# Patient Record
Sex: Male | Born: 1990 | Race: Black or African American | Hispanic: No | Marital: Single | Smoking: Current every day smoker
Health system: Southern US, Community
[De-identification: ages and names within clinical notes are randomized; demographics above are authoritative.]

## PROBLEM LIST (undated history)

## (undated) DIAGNOSIS — H9192 Unspecified hearing loss, left ear: Secondary | ICD-10-CM

---
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
## 2011-07-01 ENCOUNTER — Emergency Department (HOSPITAL_COMMUNITY)
Admission: EM | Admit: 2011-07-01 | Discharge: 2011-07-01 | Disposition: A | Discharge status: Home / Self Care | Payer: Self-pay | Attending: Emergency Medicine | Admitting: Emergency Medicine

## 2011-07-01 DIAGNOSIS — IMO0002 Reserved for concepts with insufficient information to code with codable children: Secondary | ICD-10-CM | POA: Insufficient documentation

## 2011-07-01 DIAGNOSIS — J45909 Unspecified asthma, uncomplicated: Secondary | ICD-10-CM | POA: Insufficient documentation

## 2011-07-01 DIAGNOSIS — W1809XA Striking against other object with subsequent fall, initial encounter: Secondary | ICD-10-CM | POA: Insufficient documentation

## 2011-09-16 ENCOUNTER — Encounter: Payer: Self-pay | Admitting: *Deleted

## 2011-09-16 ENCOUNTER — Emergency Department (HOSPITAL_COMMUNITY)
Admission: EM | Admit: 2011-09-16 | Discharge: 2011-09-16 | Disposition: A | Discharge status: Home / Self Care | Payer: Medicaid Other | Attending: Emergency Medicine | Admitting: Emergency Medicine

## 2011-09-16 DIAGNOSIS — J45909 Unspecified asthma, uncomplicated: Secondary | ICD-10-CM | POA: Insufficient documentation

## 2011-09-16 DIAGNOSIS — R0602 Shortness of breath: Secondary | ICD-10-CM | POA: Insufficient documentation

## 2011-09-16 MED ORDER — ALBUTEROL SULFATE HFA 108 (90 BASE) MCG/ACT IN AERS
2.0000 | INHALATION_SPRAY | RESPIRATORY_TRACT | Status: DC | PRN
  Administered 2011-09-16: 2 via RESPIRATORY_TRACT
  Filled 2011-09-16: qty 6.7

## 2011-09-16 MED ORDER — IPRATROPIUM BROMIDE 0.02 % IN SOLN
0.5000 mg | Freq: Once | RESPIRATORY_TRACT | Status: AC
  Administered 2011-09-16: 0.5 mg via RESPIRATORY_TRACT
  Filled 2011-09-16: qty 2.5

## 2011-09-16 MED ORDER — ALBUTEROL SULFATE (5 MG/ML) 0.5% IN NEBU
5.0000 mg | INHALATION_SOLUTION | Freq: Once | RESPIRATORY_TRACT | Status: AC
  Administered 2011-09-16: 5 mg via RESPIRATORY_TRACT
  Filled 2011-09-16: qty 1

## 2011-09-16 NOTE — ED Notes (Signed)
Pt reports asthma flare up tonight. Pt speaking in complete sentences, no retractions.  No audible wheezing noted. Pt ambulatory.

## 2011-09-16 NOTE — ED Provider Notes (Signed)
Medical screening examination/treatment/procedure(s) were performed by non-physician practitioner and as supervising physician I was immediately available for consultation/collaboration.   Raymond Hinde W Lamira Borin, MD 09/16/11 0713 

## 2011-09-16 NOTE — ED Notes (Signed)
Pt reports he feels like he is having an asthma attack.  Pt states that now his breathing is perfect but he hasnt had a breathing treatment in several years and feels like he needs one.  Pt denies SOB, no wheezing noted-all lung fields clear.  Skin warm, dry and intact.  Neuro intact.  Pt ambulatory without difficulty.

## 2011-09-16 NOTE — ED Provider Notes (Signed)
History     CSN: [id number]  Arrival date & time 09/16/11  4098   First MD Initiated Contact with Patient 09/16/11 0345      Chief Complaint  Patient presents with  . Asthma     Patient is a 20 y.o. male presenting with asthma. The history is provided by the patient.  Asthma This is a recurrent problem. The current episode started yesterday. The problem has been unchanged. The symptoms are aggravated by nothing.  patient reports onset of shortness of breath tonight. States it felt difficult to take a deep breath. Has a history of asthma and states the symptoms are just like symptoms he experiences with his asthma. Has not had symptoms associated with his asthma for several years so had no medication available. Denies fever chest pain or other associated symptoms.  Past Medical History  Diagnosis Date  . Asthma     History reviewed. No pertinent past surgical history.  History reviewed. No pertinent family history.  History  Substance Use Topics  . Smoking status: Never Smoker   . Smokeless tobacco: Not on file  . Alcohol Use: No      Review of Systems  Constitutional: Negative.   HENT: Negative.   Eyes: Negative.   Respiratory: Negative.   Cardiovascular: Negative.   Gastrointestinal: Negative.   Genitourinary: Negative.   Musculoskeletal: Negative.   Skin: Negative.   Neurological: Negative.   Hematological: Negative.   Psychiatric/Behavioral: Negative.     Allergies  Review of patient's allergies indicates no known allergies.  Home Medications  No current outpatient prescriptions on file.  BP 121/75  Pulse 92  Temp 97.3 F (36.3 C)  Resp 18  Ht 5\' 6"  (1.676 m)  Wt 150 lb (68.04 kg)  BMI 24.21 kg/m2  SpO2 95%  Physical Exam  Constitutional: He is oriented to person, place, and time. He appears well-developed and well-nourished.  HENT:  Head: Normocephalic and atraumatic.  Eyes: Conjunctivae are normal.  Neck: Neck supple.  Cardiovascular:  Normal rate and regular rhythm.   Pulmonary/Chest: Effort normal and breath sounds normal. Not tachypneic. No respiratory distress.       Subtle expiratory wheezes bil  Musculoskeletal: Normal range of motion.  Neurological: He is alert and oriented to person, place, and time.  Skin: Skin is warm and dry.  Psychiatric: He has a normal mood and affect.    ED Course  Procedures findings and clinical impression discussed with patient. Patient reports breathing easier after neb. BBS CTA.  Labs Reviewed - No data to display No results found.   1. Asthma       MDM  History of present illness/PE and clinical findings consistent with asthma attack/mild.        Leanne Chang, NP 09/16/11 [phone]

## 2012-01-03 ENCOUNTER — Emergency Department (HOSPITAL_COMMUNITY)
Admission: EM | Admit: 2012-01-03 | Discharge: 2012-01-03 | Disposition: A | Discharge status: Home / Self Care | Payer: Medicaid Other | Attending: Emergency Medicine | Admitting: Emergency Medicine

## 2012-01-03 ENCOUNTER — Encounter (HOSPITAL_COMMUNITY): Payer: Self-pay | Admitting: Emergency Medicine

## 2012-01-03 DIAGNOSIS — H109 Unspecified conjunctivitis: Secondary | ICD-10-CM | POA: Insufficient documentation

## 2012-01-03 DIAGNOSIS — Z711 Person with feared health complaint in whom no diagnosis is made: Secondary | ICD-10-CM | POA: Insufficient documentation

## 2012-01-03 DIAGNOSIS — H5789 Other specified disorders of eye and adnexa: Secondary | ICD-10-CM | POA: Insufficient documentation

## 2012-01-03 MED ORDER — PROPARACAINE HCL 0.5 % OP SOLN
1.0000 [drp] | Freq: Once | OPHTHALMIC | Status: DC
  Filled 2012-01-03 (×2): qty 15

## 2012-01-03 MED ORDER — AZITHROMYCIN 250 MG PO TABS
1000.0000 mg | ORAL_TABLET | Freq: Once | ORAL | Status: AC
Start: 2012-01-03 — End: 2012-01-03
  Administered 2012-01-03: 1000 mg via ORAL
  Filled 2012-01-03: qty 4

## 2012-01-03 MED ORDER — TETRACAINE HCL 0.5 % OP SOLN
OPHTHALMIC | Status: AC
  Filled 2012-01-03: qty 2

## 2012-01-03 MED ORDER — FLUORESCEIN SODIUM 1 MG OP STRP
1.0000 | ORAL_STRIP | Freq: Once | OPHTHALMIC | Status: DC
  Filled 2012-01-03: qty 1

## 2012-01-03 MED ORDER — CEFTRIAXONE SODIUM 250 MG IJ SOLR
250.0000 mg | Freq: Once | INTRAMUSCULAR | Status: AC
  Administered 2012-01-03: 250 mg via INTRAMUSCULAR
  Filled 2012-01-03 (×2): qty 250

## 2012-01-03 MED ORDER — OFLOXACIN 0.3 % OP SOLN
2.0000 [drp] | Freq: Four times a day (QID) | OPHTHALMIC | Status: DC
  Administered 2012-01-03: 2 [drp] via OPHTHALMIC
  Filled 2012-01-03: qty 5

## 2012-01-03 NOTE — ED Notes (Signed)
PT. REPORTS FOREIGN BODY AT LEFT EYE ONSET 1 WEEK AGO WITH PAIN .

## 2012-01-03 NOTE — Discharge Instructions (Signed)
Use eye drop to left eye with 2 drops 3 times daily for the next 5-7 days.  Follow up with health care clinic for further evaluation and care.  Conjunctivitis Conjunctivitis is commonly called "pink eye." Conjunctivitis can be caused by bacterial or viral infection, allergies, or injuries. There is usually redness of the lining of the eye, itching, discomfort, and sometimes discharge. There may be deposits of matter along the eyelids. A viral infection usually causes a watery discharge, while a bacterial infection causes a yellowish, thick discharge. Pink eye is very contagious and spreads by direct contact. You may be given antibiotic eyedrops as part of your treatment. Before using your eye medicine, remove all drainage from the eye by washing gently with warm water and cotton balls. Continue to use the medication until you have awakened 2 mornings in a row without discharge from the eye. Do not rub your eye. This increases the irritation and helps spread infection. Use separate towels from other household members. Wash your hands with soap and water before and after touching your eyes. Use cold compresses to reduce pain and sunglasses to relieve irritation from light. Do not wear contact lenses or wear eye makeup until the infection is gone. SEEK MEDICAL CARE IF:   Your symptoms are not better after 3 days of treatment.   You have increased pain or trouble seeing.   The outer eyelids become very red or swollen.  Document Released: 10/19/2004 Document Revised: 08/31/2011 Document Reviewed: 09/11/2005 [facility] Patient Information 2012 [place], [place].   RESOURCE GUIDE  Dental Problems  Patients with Medicaid: [facility]                     [phone] [address].                                           Phone:  [phone]                                                  If unable to pay or uninsured, contact:  [facility] or [facility]. to become qualified  for the adult dental clinic.  Chronic Pain Problems Contact [facility]  [phone] Patients need to be referred by their primary care doctor.  Insufficient Money for Medicine Contact [employer]:  call "211" or [facility] [phone].  No Primary Care Doctor Call [facility]  [phone] Other agencies that provide inexpensive medical care    [facility]  [phone]    [facility]  [phone]    [facility]  [phone]    [facility]  [phone]    [facility]  717-714-6706    [facility]  [phone]  Substance Abuse Resources [facility]  8106161115 [facility] [phone] [place] [phone] Floydene Flock [phone] Residential & Outpatient Substance Abuse Program  [phone]  Psychological Services [facility]  [phone] [facility]  [phone] [facility]   [phone] (emergency services [phone])  Abuse/Neglect [facility] Child Abuse Hotline [phone] 467-9108 [facility] Child Abuse Hotline (986)365-9023 (After Hours)  Emergency Shelter [facility] 765-794-7588  Maternity Homes  Room at the [place] [facility] [place] [phone] [facility] Services [phone]  MRSA Hotline #:   5340154974    [facility] Resources  [facility]  [employer]                           [facility] Dept. [address] [place]                     [address]         [address]  Blondell Reveal Phone:  [phone]                                  Phone:  [phone]                   Phone:  [phone]  [facility] Mental Health Phone:  [phone]  [facility] Child Abuse Hotline 973 620 [phone] (After Hours)

## 2012-01-03 NOTE — ED Provider Notes (Signed)
History     CSN: [id number]  Arrival date & time 01/03/12  9528   First MD Initiated Contact with Patient 01/03/12 [phone]      Chief Complaint  Patient presents with  . Foreign Body in Eye    (Consider location/radiation/quality/duration/timing/severity/associated sxs/prior treatment) HPI  21 year old male presents to ED with chief complaint of left eye pain. Patient states for the past week he has been noticing sensation of foreign object in his left eye. Patient can be felt usually at nighttime when he move his L eye. Symptoms associated with occasional conjunctivitis. He denies of eyes matted shut. He denies a pressure behind eyes. He denies diplopia or change in vision. He denies pain with eye movement. He denies any precipitating factor. He is a contact lens wearer.  Patient also request for an STD check. States she is sexually active with at least 3 partners in the past 6 months, unprotected. He denies any prior history of STDs. He denies asking any of his partner of their history of STD.  Patient denies fever, weight changes, night sweats. He denies of penile discharge or burning urination. He denies rash.  Past Medical History  Diagnosis Date  . Asthma     History reviewed. No pertinent past surgical history.  No family history on file.  History  Substance Use Topics  . Smoking status: Never Smoker   . Smokeless tobacco: Not on file  . Alcohol Use: No      Review of Systems  All other systems reviewed and are negative.    Allergies  Review of patient's allergies indicates no known allergies.  Home Medications  No current outpatient prescriptions on file.  BP 119/70  Pulse 76  Resp 19  SpO2 99%  Physical Exam  Nursing note and vitals reviewed. Constitutional: He appears well-developed and well-nourished. No distress.  HENT:  Head: Normocephalic.  Mouth/Throat: No oropharyngeal exudate.  Eyes: EOM are normal. Pupils are equal, round, and reactive to  light. Right eye exhibits no chemosis, no discharge, no exudate and no hordeolum. No foreign body present in the right eye. Left eye exhibits discharge. Left eye exhibits no chemosis, no exudate and no hordeolum. No foreign body present in the left eye. Right conjunctiva is not injected. Right conjunctiva has no hemorrhage. Left conjunctiva is injected. Left conjunctiva has no hemorrhage.  Neck: Neck supple.  Abdominal: Hernia confirmed negative in the right inguinal area and confirmed negative in the left inguinal area.  Genitourinary: Testes normal and penis normal. Right testis shows no swelling and no tenderness. Left testis shows no swelling and no tenderness. Circumcised. No penile tenderness. No discharge found.  Musculoskeletal: Normal range of motion.  Lymphadenopathy:    He has no cervical adenopathy.       Right: No inguinal adenopathy present.       Left: No inguinal adenopathy present.  Neurological: He is alert.  Skin: Skin is warm.    ED Course  Procedures (including critical care time)  Labs Reviewed - No data to display No results found.   No diagnosis found.  Visual Acuity - Bilateral Near: 20/30 ; R Near: 20/20 ; L Near: 20/20   MDM  21 year old male presents with left eye discomfort with foreign body sensation. Examination of L eye remarkable for limbic sparing conjunctivitis.  Mild discharge noted at the lateral canthus without evidence of abscess. No foreign body seen or palpated. Mild decreased vision change noted on visual acuity, but pt denies subjective visual changes  and sts he does not have his contact on. Normal peripheral vision test. No chemosis. No afferent pupillary defect.  Contact is not in place.  Ofloxacin ophthalmic ointment given here in ED.    Pt also request for STD check.  GC/Chlamydia culture obtained.  Will treat prophylactic with rocephen/zithromax.  Will refer to opthalmology for further evaluation.  Recommend no driving until being seen by  optho.  Pt voice understanding.       Fayrene Helper, PA-C 01/03/12 [phone]

## 2012-01-04 NOTE — ED Provider Notes (Signed)
Medical screening examination/treatment/procedure(s) were performed by non-physician practitioner and as supervising physician I was immediately available for consultation/collaboration.  Cyndra Numbers, MD 01/04/12 1309

## 2012-05-19 ENCOUNTER — Encounter (HOSPITAL_COMMUNITY): Payer: Self-pay | Admitting: Emergency Medicine

## 2012-05-19 ENCOUNTER — Emergency Department (HOSPITAL_COMMUNITY)
Admission: EM | Admit: 2012-05-19 | Discharge: 2012-05-19 | Disposition: A | Discharge status: Home / Self Care | Payer: Self-pay | Attending: Emergency Medicine | Admitting: Emergency Medicine

## 2012-05-19 DIAGNOSIS — B356 Tinea cruris: Secondary | ICD-10-CM | POA: Insufficient documentation

## 2012-05-19 DIAGNOSIS — J45909 Unspecified asthma, uncomplicated: Secondary | ICD-10-CM | POA: Insufficient documentation

## 2012-05-19 MED ORDER — CLOTRIMAZOLE 1 % EX CREA: TOPICAL_CREAM | CUTANEOUS | Status: DC

## 2012-05-19 NOTE — ED Notes (Signed)
Pt c/o rash with itching in groin area x 1 week; pt denies discharge or burning with urination

## 2012-05-19 NOTE — ED Provider Notes (Signed)
History  Scribed for Gwyneth Sprout, MD, the patient was seen in room TR07C/TR07C. This chart was scribed by Candelaria Stagers. The patient's care started at 11:29 AM    CSN: [id number]  Arrival date & time 05/19/12  4098   First MD Initiated Contact with Patient 05/19/12 1124      Chief Complaint  Patient presents with  . Rash     The history is provided by the patient. No language interpreter was used.   Raymond Orozco is a 21 y.o. male who presents to the Emergency Department complaining of a rash and itching to his groin area that started a little more than a week ago, but has gotten worse over the last week.  He denies penile discharge, dysuria, or abdominal pain.  He has applied alcohol and benadryl with no relief.  Pt reports that he has been working outside more than normal.   Past Medical History  Diagnosis Date  . Asthma     History reviewed. No pertinent past surgical history.  History reviewed. No pertinent family history.  History  Substance Use Topics  . Smoking status: Never Smoker   . Smokeless tobacco: Not on file  . Alcohol Use: No      Review of Systems  Constitutional: Negative for fever.       10 Systems reviewed and are negative for acute change except as noted in the HPI.  HENT: Negative for congestion.   Eyes: Negative for discharge and redness.  Respiratory: Negative for cough and shortness of breath.   Cardiovascular: Negative for chest pain.  Gastrointestinal: Negative for vomiting and abdominal pain.  Genitourinary: Negative for dysuria and discharge.  Musculoskeletal: Negative for back pain.  Skin: Positive for rash (groin area).  Neurological: Negative for syncope, numbness and headaches.  Psychiatric/Behavioral:       No behavior change.    Allergies  Review of patient's allergies indicates no known allergies.  Home Medications   Current Outpatient Rx  Name Route Sig Dispense Refill  . ALBUTEROL SULFATE HFA 108 (90 BASE)  MCG/ACT IN AERS Inhalation Inhale 2 puffs into the lungs every 6 (six) hours as needed. wheezing      BP 117/71  Pulse 73  Temp 98.2 F (36.8 C) (Oral)  Resp 18  SpO2 99%  Physical Exam  Nursing note and vitals reviewed. Constitutional:       Awake, alert, nontoxic appearance.  HENT:  Head: Atraumatic.  Eyes: Right eye exhibits no discharge. Left eye exhibits no discharge.  Neck: Neck supple.  Pulmonary/Chest: Effort normal. He exhibits no tenderness.  Abdominal: Soft. There is no tenderness. There is no rebound.  Genitourinary:       Groin area has small area of excoriated erythemas rash.  No skin breakdown, no fluctuants, no induration, no lesions, no testicular swelling.   Musculoskeletal: He exhibits no tenderness.       Baseline ROM, no obvious new focal weakness.  Neurological:       Mental status and motor strength appears baseline for patient and situation.  Skin: No rash noted.  Psychiatric: He has a normal mood and affect.    ED Course  Procedures   DIAGNOSTIC STUDIES: Oxygen Saturation is 99% on room air, normal by my interpretation.    COORDINATION OF CARE:     Labs Reviewed - No data to display No results found.   1. Tinea cruris       MDM   Pt with sx most consistent with  tinea cruris.  No STD sx or signs.  No lesions or other abnormalities on exam.  Clotrimazole and return for worsening sx. I personally performed the services described in this documentation, which was scribed in my presence.  The recorded information has been reviewed and considered.         Gwyneth Sprout, MD 05/19/12 [phone]

## 2012-08-25 ENCOUNTER — Emergency Department (HOSPITAL_COMMUNITY)
Admission: EM | Admit: 2012-08-25 | Discharge: 2012-08-25 | Disposition: A | Discharge status: Home / Self Care | Payer: Self-pay | Attending: Emergency Medicine | Admitting: Emergency Medicine

## 2012-08-25 ENCOUNTER — Encounter (HOSPITAL_COMMUNITY): Payer: Self-pay | Admitting: Nurse Practitioner

## 2012-08-25 DIAGNOSIS — K0889 Other specified disorders of teeth and supporting structures: Secondary | ICD-10-CM

## 2012-08-25 DIAGNOSIS — H5710 Ocular pain, unspecified eye: Secondary | ICD-10-CM | POA: Insufficient documentation

## 2012-08-25 DIAGNOSIS — H9209 Otalgia, unspecified ear: Secondary | ICD-10-CM | POA: Insufficient documentation

## 2012-08-25 DIAGNOSIS — J45909 Unspecified asthma, uncomplicated: Secondary | ICD-10-CM | POA: Insufficient documentation

## 2012-08-25 DIAGNOSIS — K089 Disorder of teeth and supporting structures, unspecified: Secondary | ICD-10-CM | POA: Insufficient documentation

## 2012-08-25 MED ORDER — PENICILLIN V POTASSIUM 500 MG PO TABS: 500.0000 mg | ORAL_TABLET | Freq: Three times a day (TID) | ORAL | Status: DC

## 2012-08-25 MED ORDER — OXYCODONE-ACETAMINOPHEN 5-325 MG PO TABS: 1.0000 | ORAL_TABLET | Freq: Four times a day (QID) | ORAL | Status: DC | PRN

## 2012-08-25 MED ORDER — IBUPROFEN 600 MG PO TABS: 600.0000 mg | ORAL_TABLET | Freq: Four times a day (QID) | ORAL | Status: DC | PRN

## 2012-08-25 NOTE — ED Provider Notes (Signed)
History   This chart was scribed for Ethelda Chick, MD by Melba Coon, ED Scribe. The patient was seen in room TR09C/TR09C and the patient's care was started at 3:38PM.    CSN: [id number]  Arrival date & time 08/25/12  1352   None     Chief Complaint  Patient presents with  . Dental Pain    (Consider location/radiation/quality/duration/timing/severity/associated sxs/prior treatment) Patient is a 21 y.o. male presenting with tooth pain. The history is provided by the patient. No language interpreter was used.  Dental PainThe primary symptoms include mouth pain (left upper) and dental injury (tooth broke about 6 months ago). Episode onset: a week ago. The symptoms are worsening. The symptoms are new.  He also reports left otalgia and left orbital pain. Tylenol and Orojel did not alleviate the pain. Denies HA, fever, neck pain, sore throat, rash, back pain, CP, SOB, abdominal pain, nausea, emesis, diarrhea, dysuria, or extremity pain, edema, weakness, numbness, or tingling. No known allergies. No other pertinent medical symptoms.  Past Medical History  Diagnosis Date  . Asthma     History reviewed. No pertinent past surgical history.  History reviewed. No pertinent family history.  History  Substance Use Topics  . Smoking status: Never Smoker   . Smokeless tobacco: Not on file  . Alcohol Use: No      Review of Systems 10 Systems reviewed and all are negative for acute change except as noted in the HPI.   Allergies  Review of patient's allergies indicates no known allergies.  Home Medications   Current Outpatient Rx  Name  Route  Sig  Dispense  Refill  . ACETAMINOPHEN 325 MG PO TABS   Oral   Take 650 mg by mouth every 6 (six) hours as needed. For pain         . BC FAST PAIN RELIEF PO   Oral   Take 1 packet by mouth 2 (two) times daily as needed. For pain         . BENZOCAINE 10 % MT GEL   Mouth/Throat   Use as directed 1 application in the mouth or  throat 3 (three) times daily as needed. For mouth pain         . IBUPROFEN 600 MG PO TABS   Oral   Take 1 tablet (600 mg total) by mouth every 6 (six) hours as needed for pain.   30 tablet   0   . OXYCODONE-ACETAMINOPHEN 5-325 MG PO TABS   Oral   Take 1-2 tablets by mouth every 6 (six) hours as needed for pain.   15 tablet   0   . PENICILLIN V POTASSIUM 500 MG PO TABS   Oral   Take 1 tablet (500 mg total) by mouth 3 (three) times daily.   30 tablet   0     BP 122/81  Pulse 55  Temp 98.4 F (36.9 C) (Oral)  Resp 16  SpO2 100%  Physical Exam  Nursing note and vitals reviewed. Constitutional:       Awake, alert, nontoxic appearance.  HENT:  Head: Atraumatic.       Dental Exam: multiple teeth with decay. Left upper canine tooth is broken at the gumline with no surrounding erythema or abscess. No swelling under the tongue.  Eyes: Right eye exhibits no discharge. Left eye exhibits no discharge.  Neck: Neck supple.  Pulmonary/Chest: Effort normal. He exhibits no tenderness.  Abdominal: Soft. There is no tenderness. There is no  rebound.  Musculoskeletal: He exhibits no tenderness.       Baseline ROM, no obvious new focal weakness.  Lymphadenopathy:    He has no cervical adenopathy.  Neurological:       Mental status and motor strength appears baseline for patient and situation.  Skin: No rash noted.  Psychiatric: He has a normal mood and affect.    ED Course  Procedures (including critical care time)  COORDINATION OF CARE:  3:44PM - abx and pain meds will be ordered for [employer]. He is advised to f/u with a dentist tomorrow. He is ready for d/c.   Labs Reviewed - No data to display No results found.   1. Pain, dental       MDM  Pt presenting with c/o dental pain.  Pt started on antibiotics, pain medication.  Given information for dental f/u.  Discharged with strict return precautions.  Pt agreeable with plan.  I personally performed the services  described in this documentation, which was scribed in my presence. The recorded information has been reviewed and is accurate.         Ethelda Chick, MD 08/27/12 [phone]

## 2012-08-25 NOTE — ED Notes (Signed)
Pt c/o L upper toothache x 1 week

## 2014-05-09 ENCOUNTER — Emergency Department (HOSPITAL_COMMUNITY)
Admission: EM | Admit: 2014-05-09 | Discharge: 2014-05-09 | Discharge status: Absent without leave | Payer: Medicaid Other | Source: Home / Self Care

## 2015-07-17 ENCOUNTER — Emergency Department (HOSPITAL_COMMUNITY)
Admission: EM | Admit: 2015-07-17 | Discharge: 2015-07-17 | Disposition: A | Discharge status: Home / Self Care | Payer: Self-pay | Attending: Emergency Medicine | Admitting: Emergency Medicine

## 2015-07-17 ENCOUNTER — Encounter (HOSPITAL_COMMUNITY): Payer: Self-pay | Admitting: *Deleted

## 2015-07-17 DIAGNOSIS — J45909 Unspecified asthma, uncomplicated: Secondary | ICD-10-CM | POA: Insufficient documentation

## 2015-07-17 DIAGNOSIS — K029 Dental caries, unspecified: Secondary | ICD-10-CM | POA: Insufficient documentation

## 2015-07-17 DIAGNOSIS — Z720 Tobacco use: Secondary | ICD-10-CM | POA: Insufficient documentation

## 2015-07-17 DIAGNOSIS — K0889 Other specified disorders of teeth and supporting structures: Secondary | ICD-10-CM | POA: Insufficient documentation

## 2015-07-17 DIAGNOSIS — Z792 Long term (current) use of antibiotics: Secondary | ICD-10-CM | POA: Insufficient documentation

## 2015-07-17 MED ORDER — PENICILLIN V POTASSIUM 500 MG PO TABS: 500.0000 mg | ORAL_TABLET | Freq: Three times a day (TID) | ORAL | Status: DC

## 2015-07-17 NOTE — ED Notes (Signed)
Declined W/C at D/C and was escorted to lobby by RN. 

## 2015-07-17 NOTE — ED Provider Notes (Signed)
CSN: [id number]     Arrival date & time 07/17/15  1653 History  By signing my name below, I, Elon Spanner, attest that this documentation has been prepared under the direction and in the presence of Camprubi-Soms, [employer], PA-C . Electronically Signed: Elon Spanner ED Scribe. 07/17/2015. 5:12 PM.    Chief Complaint  Patient presents with  . Dental Pain   Patient is a 24 y.o. male presenting with tooth pain. The history is provided by the patient and medical records. No language interpreter was used.  Dental Pain Location:  Lower (right) Lower teeth location:  32/RL 3rd molar Quality:  Sharp Severity:  Moderate Onset quality:  Gradual Duration:  1 week Timing:  Constant Progression:  Unchanged Chronicity:  Recurrent Previous work-up:  Root canal Relieved by:  Acetaminophen Worsened by:  Cold food/drink (cold air movement) Ineffective treatments:  None tried Associated symptoms: no difficulty swallowing, no drooling, no facial pain, no facial swelling, no fever, no gum swelling, no neck pain, no neck swelling, no oral lesions and no trismus   Risk factors: lack of dental care and smoking    HPI Comments: JOHNATHYN VISCOMI is a 24 y.o. male who presents to the Emergency Department complaining of constant 8/10, sharp nonradiating right lower dental pain onset 1wk ago, although has occurred intermittently over the last 56yrs; worse with cold air movement; somewhat relieved by ES Tylenol.  The patient reports a prior hx of root canal of the painful tooth 2 years ago but has not been seen by a dentist for this complaint since.  He reports this pain typically seems to surface in October or November every year.  Patient is a current smoker.  He denies gum swelling, facial swelling, trismus, fever, chills, drooling, trouble swallowing, neck swelling/pain, CP, SOB, abdominal pain, n/v/d/c, ear pain, ear drainage, hematuria, dysuria, numbness, tingling, or weakness.    Past Medical History   Diagnosis Date  . Asthma    History reviewed. No pertinent past surgical history. History reviewed. No pertinent family history. Social History  Substance Use Topics  . Smoking status: Never Smoker   . Smokeless tobacco: None  . Alcohol Use: No    Review of Systems  Constitutional: Negative for fever and chills.  HENT: Positive for dental problem. Negative for drooling, ear discharge, ear pain, facial swelling, mouth sores, sore throat and trouble swallowing.   Respiratory: Negative for shortness of breath.   Cardiovascular: Negative for chest pain.  Gastrointestinal: Negative for nausea, vomiting, abdominal pain, diarrhea and constipation.  Genitourinary: Negative for dysuria and hematuria.  Musculoskeletal: Negative for neck pain.  Skin: Negative for color change.  Allergic/Immunologic: Negative for immunocompromised state.  Neurological: Negative for weakness and numbness.  10 Systems reviewed and all are negative for acute change except as noted in the HPI.  Allergies  Review of patient's allergies indicates no known allergies.  Home Medications   Prior to Admission medications   Medication Sig Start Date End Date Taking? Authorizing Provider  acetaminophen (TYLENOL) 325 MG tablet Take 650 mg by mouth every 6 (six) hours as needed. For pain    Historical Provider, MD  Aspirin-Caffeine (BC FAST PAIN RELIEF PO) Take 1 packet by mouth 2 (two) times daily as needed. For pain    Historical Provider, MD  benzocaine (ORAJEL) 10 % mucosal gel Use as directed 1 application in the mouth or throat 3 (three) times daily as needed. For mouth pain    Historical Provider, MD  ibuprofen (ADVIL,MOTRIN)  600 MG tablet Take 1 tablet (600 mg total) by mouth every 6 (six) hours as needed for pain. 08/25/12   Jerelyn Scott, MD  oxyCODONE-acetaminophen (PERCOCET/ROXICET) 5-325 MG per tablet Take 1-2 tablets by mouth every 6 (six) hours as needed for pain. 08/25/12   Jerelyn Scott, MD  penicillin v  potassium (VEETID) 500 MG tablet Take 1 tablet (500 mg total) by mouth 3 (three) times daily. 08/25/12   Jerelyn Scott, MD   BP 124/69 mmHg  Pulse 55  Temp(Src) 98 F (36.7 C) (Oral)  Resp 16  Ht  (1.626 m)  Wt 156 lb (70.761 kg)  BMI 26.76 kg/m2  SpO2 97% Physical Exam  Constitutional: He is oriented to person, place, and time. Vital signs are normal. He appears well-developed and well-nourished.  Non-toxic appearance. No distress.  Afebrile, nontoxic, NAD  HENT:  Head: Normocephalic and atraumatic.  Nose: Nose normal.  Mouth/Throat: Uvula is midline, oropharynx is clear and moist and mucous membranes are normal. No trismus in the jaw. Dental caries present. No dental abscesses or uvula swelling.    Right lower molar #32 with TTP, mild surrounding gingival erythema without swelling or obvious abscess.  Diffuse dental decay.  Nose clear. Oropharynx clear and moist, without uvular swelling or deviation, no trismus or drooling, no tonsillar swelling or erythema, no exudates.    Eyes: Conjunctivae and EOM are normal. Right eye exhibits no discharge. Left eye exhibits no discharge.  Neck: Normal range of motion. Neck supple.  Cardiovascular: Normal rate.   Pulmonary/Chest: Effort normal. No respiratory distress.  Abdominal: Normal appearance. He exhibits no distension.  Musculoskeletal: Normal range of motion.  Neurological: He is alert and oriented to person, place, and time. He has normal strength. No sensory deficit.  Skin: Skin is warm, dry and intact. No rash noted.  Psychiatric: He has a normal mood and affect.  Nursing note and vitals reviewed.   ED Course  Procedures (including critical care time)  DIAGNOSTIC STUDIES: Oxygen Saturation is 97% on RAS, normal by my interpretation.    COORDINATION OF CARE:  5:12 PM Discussed plan to prescribe antibiotics.  Patient should f/u with dentist and stop smoking.    Labs Review Labs Reviewed - No data to display  Imaging  Review No results found. I have personally reviewed and evaluated these images and lab results as part of my medical decision-making.   EKG Interpretation None      MDM   Final diagnoses:  Pain due to dental caries  Dental decay  Tobacco abuse    23 y.o. male here with Dental pain associated with dental decay and possible dental infection with patient afebrile, non toxic appearing and swallowing secretions well. I gave patient referral to dentist and stressed the importance of dental follow up for ultimate management of dental pain.  I have also discussed reasons to return immediately to the ER.  Patient expresses understanding and agrees with plan.  I will also give PCN VK and discussed tylenol/motrin for pain. Discussed smoking cessation.  I personally performed the services described in this documentation, which was scribed in my presence. The recorded information has been reviewed and is accurate.  BP 124/69 mmHg  Pulse 55  Temp(Src) 98 F (36.7 C) (Oral)  Resp 16  Ht  (1.626 m)  Wt 156 lb (70.761 kg)  BMI 26.76 kg/m2  SpO2 97%  Meds ordered this encounter  Medications  . penicillin v potassium (VEETID) 500 MG tablet  Sig: Take 1 tablet (500 mg total) by mouth 3 (three) times daily. X 7 days    Dispense:  21 tablet    Refill:  0    Order Specific Question:  Supervising Provider    Answer:  Eber HongMILLER, BRIAN [[id number]]      Xavian Hardcastle Camprubi-Soms, PA-C 07/17/15 1723  Derwood KaplanAnkit Nanavati, MD 07/17/15 2023

## 2015-07-17 NOTE — ED Notes (Signed)
Pt reports dental pain for 7 days . Location RT bottom tooth.

## 2015-07-17 NOTE — Discharge Instructions (Signed)
Apply warm compresses to jaw throughout the day. Take antibiotic until finished. Take tylenol or motrin as needed for pain. Followup with a dentist is very important for ongoing evaluation and management of recurrent dental pain. Use the list below to find a dentist. STOP SMOKING! Return to emergency department for emergent changing or worsening symptoms.   Dental Caries Dental caries is tooth decay. This decay can cause a hole in teeth (cavity) that can get bigger and deeper over time. HOME CARE  Brush and floss your teeth. Do this at least two times a day.  Use a fluoride toothpaste.  Use a mouth rinse if told by your dentist or doctor.  Eat less sugary and starchy foods. Drink less sugary drinks.  Avoid snacking often on sugary and starchy foods. Avoid sipping often on sugary drinks.  Keep regular checkups and cleanings with your dentist.  Use fluoride supplements if told by your dentist or doctor.  Allow fluoride to be applied to teeth if told by your dentist or doctor.   This information is not intended to replace advice given to you by your health care provider. Make sure you discuss any questions you have with your health care provider.   Document Released: 06/20/2008 Document Revised: 10/02/2014 Document Reviewed: 09/13/2012 Elsevier Interactive Patient Education 2016 Elsevier Inc.  Dental Pain Dental pain may be caused by many things, including:  Tooth decay (cavities or caries). Cavities expose the nerve of your tooth to air and hot or cold temperatures. This can cause pain or discomfort.  Abscess or infection. A dental abscess is a collection of infected pus from a bacterial infection in the inner part of the tooth (pulp). It usually occurs at the end of the tooth's root.  Injury.  An unknown reason (idiopathic). Your pain may be mild or severe. It may only occur when:  You are chewing.  You are exposed to hot or cold temperature.  You are eating or drinking  sugary foods or beverages, such as soda or candy. Your pain may also be constant. HOME CARE INSTRUCTIONS Watch your dental pain for any changes. The following actions may help to lessen any discomfort that you are feeling:  Take medicines only as directed by your dentist.  If you were prescribed an antibiotic medicine, finish all of it even if you start to feel better.  Keep all follow-up visits as directed by your dentist. This is important.  Do not apply heat to the outside of your face.  Rinse your mouth or gargle with salt water if directed by your dentist. This helps with pain and swelling.  You can make salt water by adding  tsp of salt to 1 cup of warm water.  Apply ice to the painful area of your face:  Put ice in a plastic bag.  Place a towel between your skin and the bag.  Leave the ice on for 20 minutes, 2-3 times per day.  Avoid foods or drinks that cause you pain, such as:  Very hot or very cold foods or drinks.  Sweet or sugary foods or drinks. SEEK MEDICAL CARE IF:  Your pain is not controlled with medicines.  Your symptoms are worse.  You have new symptoms. SEEK IMMEDIATE MEDICAL CARE IF:  You are unable to open your mouth.  You are having trouble breathing or swallowing.  You have a fever.  Your face, neck, or jaw is swollen.   This information is not intended to replace advice given to you  by your health care provider. Make sure you discuss any questions you have with your health care provider.   Document Released: 09/11/2005 Document Revised: 01/26/2015 Document Reviewed: 09/07/2014 Elsevier Interactive Patient Education 2016 [employer]  Smoking Cessation, Tips for Success If you are ready to quit smoking, congratulations! You have chosen to help yourself be healthier. Cigarettes bring nicotine, tar, carbon monoxide, and other irritants into your body. Your lungs, heart, and blood vessels will be able to work better without these poisons.  There are many different ways to quit smoking. Nicotine gum, nicotine patches, a nicotine inhaler, or nicotine nasal spray can help with physical craving. Hypnosis, support groups, and medicines help break the habit of smoking. WHAT THINGS CAN I DO TO MAKE QUITTING EASIER?  Here are some tips to help you quit for good:  Pick a date when you will quit smoking completely. Tell all of your friends and family about your plan to quit on that date.  Do not try to slowly cut down on the number of cigarettes you are smoking. Pick a quit date and quit smoking completely starting on that day.  Throw away all cigarettes.   Clean and remove all ashtrays from your home, work, and car.  On a card, write down your reasons for quitting. Carry the card with you and read it when you get the urge to smoke.  Cleanse your body of nicotine. Drink enough water and fluids to keep your urine clear or pale yellow. Do this after quitting to flush the nicotine from your body.  Learn to predict your moods. Do not let a bad situation be your excuse to have a cigarette. Some situations in your life might tempt you into wanting a cigarette.  Never have "just one" cigarette. It leads to wanting another and another. Remind yourself of your decision to quit.  Change habits associated with smoking. If you smoked while driving or when feeling stressed, try other activities to replace smoking. Stand up when drinking your coffee. Brush your teeth after eating. Sit in a different chair when you read the paper. Avoid alcohol while trying to quit, and try to drink fewer caffeinated beverages. Alcohol and caffeine may urge you to smoke.  Avoid foods and drinks that can trigger a desire to smoke, such as sugary or spicy foods and alcohol.  Ask people who smoke not to smoke around you.  Have something planned to do right after eating or having a cup of coffee. For example, plan to take a walk or exercise.  Try a relaxation exercise  to calm you down and decrease your stress. Remember, you may be tense and nervous for the first 2 weeks after you quit, but this will pass.  Find new activities to keep your hands busy. Play with a pen, coin, or rubber band. Doodle or draw things on paper.  Brush your teeth right after eating. This will help cut down on the craving for the taste of tobacco after meals. You can also try mouthwash.   Use oral substitutes in place of cigarettes. Try using lemon drops, carrots, cinnamon sticks, or chewing gum. Keep them handy so they are available when you have the urge to smoke.  When you have the urge to smoke, try deep breathing.  Designate your home as a nonsmoking area.  If you are a heavy smoker, ask your health care provider about a prescription for nicotine chewing gum. It can ease your withdrawal from nicotine.  Reward yourself. Set  aside the cigarette money you save and buy yourself something nice.  Look for support from others. Join a support group or smoking cessation program. Ask someone at home or at work to help you with your plan to quit smoking.  Always ask yourself, "Do I need this cigarette or is this just a reflex?" Tell yourself, "Today, I choose not to smoke," or "I do not want to smoke." You are reminding yourself of your decision to quit.  Do not replace cigarette smoking with electronic cigarettes (commonly called e-cigarettes). The safety of e-cigarettes is unknown, and some may contain harmful chemicals.  If you relapse, do not give up! Plan ahead and think about what you will do the next time you get the urge to smoke. HOW WILL I FEEL WHEN I QUIT SMOKING? You may have symptoms of withdrawal because your body is used to nicotine (the addictive substance in cigarettes). You may crave cigarettes, be irritable, feel very hungry, cough often, get headaches, or have difficulty concentrating. The withdrawal symptoms are only temporary. They are strongest when you first quit  but will go away within 10-14 days. When withdrawal symptoms occur, stay in control. Think about your reasons for quitting. Remind yourself that these are signs that your body is healing and getting used to being without cigarettes. Remember that withdrawal symptoms are easier to treat than the major diseases that smoking can cause.  Even after the withdrawal is over, expect periodic urges to smoke. However, these cravings are generally short lived and will go away whether you smoke or not. Do not smoke! WHAT RESOURCES ARE AVAILABLE TO HELP ME QUIT SMOKING? Your health care provider can direct you to community resources or hospitals for support, which may include:  Group support.  Education.  Hypnosis.  Therapy.   This information is not intended to replace advice given to you by your health care provider. Make sure you discuss any questions you have with your health care provider.   Document Released: 06/09/2004 Document Revised: 10/02/2014 Document Reviewed: 02/27/2013 Elsevier Interactive Patient Education 2016 [employer].   Emergency Department Resource Guide 1) Find a Doctor and Pay Out of Pocket Although you won't have to find out who is covered by your insurance plan, it is a good idea to ask around and get recommendations. You will then need to call the office and see if the doctor you have chosen will accept you as a new patient and what types of options they offer for patients who are self-pay. Some doctors offer discounts or will set up payment plans for their patients who do not have insurance, but you will need to ask so you aren't surprised when you get to your appointment.  2) Contact Your Local Health Department Not all health departments have doctors that can see patients for sick visits, but many do, so it is worth a call to see if yours does. If you don't know where your local health department is, you can check in your phone book. The CDC also has a tool to help you  locate your state's health department, and many state websites also have listings of all of their local health departments.  3) Find a Walk-in Clinic If your illness is not likely to be very severe or complicated, you may want to try a walk in clinic. These are popping up all over the country in pharmacies, drugstores, and shopping centers. They're usually staffed by nurse practitioners or physician assistants that have been trained to  treat common illnesses and complaints. They're usually fairly quick and inexpensive. However, if you have serious medical issues or chronic medical problems, these are probably not your best option.  No Primary Care Doctor: - Call Health Connect at  [phone] - they can help you locate a primary care doctor that  accepts your insurance, provides certain services, etc. - Physician Referral Service- [phone]  Chronic Pain Problems: Organization         Address  Phone   Notes  [facility]  [phone] Patients need to be referred by their primary care doctor.   Medication Assistance: Organization         Address  Phone   Notes  [facility] [address] [place], [place] [address] [phone] --Must be a resident of [facility] -- Must have NO insurance coverage whatsoever (no Medicaid/ Medicare, etc.) -- The pt. MUST have a primary care doctor that directs their care regularly and follows them in the community   [facility]  [phone]   [place]  [phone]     Dental Care: Organization         Address  Phone  Notes  [facility] Department of [facility] [address] [place], [place] [phone] Accepts children up to age 78 who are enrolled in [place] or [place] Health Choice; pregnant women with a Medicaid card; and children who have applied for Medicaid or [place] Health Choice, but were declined, whose parents can pay a reduced fee at time of  service.  [facility] Department of [facility]  [address], [place] [phone] Accepts children up to age 16 who are enrolled in [place] or [place] Health Choice; pregnant women with a Medicaid card; and children who have applied for Medicaid or [place] Health Choice, but were declined, whose parents can pay a reduced fee at time of service.  [facility]  [address] [place], [place] [phone] Patients are seen by appointment only. Walk-ins are not accepted. [facility] will see patients 51 years of age and older. Monday - Tuesday (8am-5pm) Most Wednesdays (8:30-5pm) $30 per visit, cash only  [facility]  [address], [place] [phone] Patients are seen by appointment only. Walk-ins are not accepted. [facility] will see patients 89 years of age and older. One Wednesday Evening (Monthly: Volunteer Based).  $30 per visit, cash only  [employer]  [phone] for adults; Children under age 60, call Graduate Pediatric Dentistry at [phone]. Children aged 28-14, please call [phone] to request a pediatric application.  Dental services are provided in all areas of dental care including fillings, crowns and bridges, complete and partial dentures, implants, gum treatment, root canals, and extractions. Preventive care is also provided. Treatment is provided to both adults and children. Patients are selected via a lottery and there is often a waiting list.   [facility] [address] [place]  [phone] [email or web address]   [facility] [address] [place], [place] [phone], Ext. [phone] Second and Fourth Thursday of each month, opens at 6:30 AM; Clinic ends at 9 AM.  Patients are seen on a first-come first-served basis, and a limited number are seen during each clinic.   [facility]  [address] [place],  [place] [phone]  Eligibility Requirements You must have lived in [place], [place], or [place] counties for at least the last three months.   You cannot be eligible for state or federal sponsored [place], including [employer], [place], or [employer].   You generally cannot be eligible for healthcare insurance through your employer.    How to apply: Eligibility screenings are held every Tuesday and Wednesday afternoon from 1:00 pm until 4:00 pm. You do not need an appointment for the interview!  [facility] [address] [place], [place] [phone]   [facility] Health Department  [phone]   [facility] Health Department  [phone]   [facility] Health Department  [phone]

## 2015-08-03 ENCOUNTER — Emergency Department (HOSPITAL_COMMUNITY): Payer: Self-pay

## 2015-08-03 ENCOUNTER — Emergency Department (HOSPITAL_COMMUNITY)
Admission: EM | Admit: 2015-08-03 | Discharge: 2015-08-03 | Disposition: A | Discharge status: Home / Self Care | Payer: Self-pay | Attending: Emergency Medicine | Admitting: Emergency Medicine

## 2015-08-03 ENCOUNTER — Encounter (HOSPITAL_COMMUNITY): Payer: Self-pay | Admitting: Emergency Medicine

## 2015-08-03 DIAGNOSIS — R079 Chest pain, unspecified: Secondary | ICD-10-CM | POA: Insufficient documentation

## 2015-08-03 DIAGNOSIS — Z792 Long term (current) use of antibiotics: Secondary | ICD-10-CM | POA: Insufficient documentation

## 2015-08-03 DIAGNOSIS — J45901 Unspecified asthma with (acute) exacerbation: Secondary | ICD-10-CM | POA: Insufficient documentation

## 2015-08-03 LAB — CBC
HEMATOCRIT: 42.7 % (ref 39.0–52.0)
HEMOGLOBIN: 13.9 g/dL (ref 13.0–17.0)
MCH: 26.2 pg (ref 26.0–34.0)
MCHC: 32.6 g/dL (ref 30.0–36.0)
MCV: 80.6 fL (ref 78.0–100.0)
Platelets: 230 10*3/uL (ref 150–400)
RBC: 5.3 MIL/uL (ref 4.22–5.81)
RDW: 12.6 % (ref 11.5–15.5)
WBC: 5.3 10*3/uL (ref 4.0–10.5)

## 2015-08-03 LAB — BASIC METABOLIC PANEL
ANION GAP: 8 (ref 5–15)
BUN: 7 mg/dL (ref 6–20)
CO2: 27 mmol/L (ref 22–32)
Calcium: 9.6 mg/dL (ref 8.9–10.3)
Chloride: 105 mmol/L (ref 101–111)
Creatinine, Ser: 0.87 mg/dL (ref 0.61–1.24)
GFR calc Af Amer: >60 mL/min (ref >60)
GLUCOSE: 88 mg/dL (ref 65–99)
POTASSIUM: 4.1 mmol/L (ref 3.5–5.1)
Sodium: 140 mmol/L (ref 135–145)

## 2015-08-03 LAB — I-STAT TROPONIN, ED: Troponin i, poc: 0.01 ng/mL (ref 0.00–0.08)

## 2015-08-03 MED ORDER — IBUPROFEN 600 MG PO TABS: 600.0000 mg | ORAL_TABLET | Freq: Four times a day (QID) | ORAL | Status: DC | PRN

## 2015-08-03 NOTE — Discharge Instructions (Signed)

## 2015-08-03 NOTE — ED Notes (Signed)
Patient here with complaint of right rib pain with onset this morning while driving. States deep breath exacerbates pain. Endorses history of asthma. Denies other recent illness.

## 2015-08-03 NOTE — ED Provider Notes (Signed)
CSN: [id number]     Arrival date & time 08/03/15  0250 History   First MD Initiated Contact with Patient 08/03/15 0458     Chief Complaint  Patient presents with  . Chest Pain     Patient is a 24 y.o. male presenting with chest pain. The history is provided by the patient.  Chest Pain Pain location:  R chest Pain quality comment:  Cramping Pain radiates to:  Does not radiate Onset quality:  Sudden Duration:  5 hours Timing:  Constant Progression:  Resolved Chronicity:  New Relieved by:  Nothing Worsened by:  Deep breathing, movement and certain positions Associated symptoms: shortness of breath   Associated symptoms: no abdominal pain, no cough, no fever, no lower extremity edema and no syncope   Associated symptoms comment:  Denies hemoptysis  Risk factors: smoking   Risk factors: no coronary artery disease, no prior DVT/PE and no surgery   pt reports while at work he had onset of right sided CP He was not exerting himself Pain worse with movement/palpation He reports mild SOB  No h/o CAD/PE/DVT  Past Medical History  Diagnosis Date  . Asthma    History reviewed. No pertinent past surgical history. History reviewed. No pertinent family history. Social History  Substance Use Topics  . Smoking status: Never Smoker   . Smokeless tobacco: None  . Alcohol Use: No    Review of Systems  Constitutional: Negative for fever.  Respiratory: Positive for shortness of breath. Negative for cough.   Cardiovascular: Positive for chest pain. Negative for syncope.  Gastrointestinal: Negative for abdominal pain.  All other systems reviewed and are negative.     Allergies  Review of patient's allergies indicates no known allergies.  Home Medications   Prior to Admission medications   Medication Sig Start Date End Date Taking? Authorizing Provider  Aspirin-Caffeine (BC FAST PAIN RELIEF PO) Take 1 packet by mouth 2 (two) times daily as needed. For pain    Historical Provider,  MD  benzocaine (ORAJEL) 10 % mucosal gel Use as directed 1 application in the mouth or throat 3 (three) times daily as needed. For mouth pain    Historical Provider, MD  ibuprofen (ADVIL,MOTRIN) 600 MG tablet Take 1 tablet (600 mg total) by mouth every 6 (six) hours as needed. 08/03/15   Zadie Rhine, MD  penicillin v potassium (VEETID) 500 MG tablet Take 1 tablet (500 mg total) by mouth 3 (three) times daily. X 7 days 07/17/15   Mercedes Camprubi-Soms, PA-C   BP 122/70 mmHg  Pulse 50  Temp(Src) 98.1 F (36.7 C) (Oral)  Resp 25  Ht  (1.6 m)  Wt 157 lb 14.4 oz (71.623 kg)  BMI 27.98 kg/m2  SpO2 100% Physical Exam CONSTITUTIONAL: Well developed/well nourished HEAD: Normocephalic/atraumatic EYES: EOMI/PERRL ENMT: Mucous membranes moist NECK: supple no meningeal signs SPINE/BACK:entire spine nontender CV: S1/S2 noted, no murmurs/rubs/gallops noted Chest - no tenderness to palpation LUNGS: Lungs are clear to auscultation bilaterally, no apparent distress ABDOMEN: soft, nontender, no rebound or guarding, bowel sounds noted throughout abdomen GU:no cva tenderness NEURO: Pt is awake/alert/appropriate, moves all extremitiesx4.  No facial droop.   EXTREMITIES: pulses normal/equal, full ROM, no calf tenderness noted SKIN: warm, color normal PSYCH: no abnormalities of mood noted, alert and oriented to situation  ED Course  Procedures   Pt well appearing Pain resolved PERC negative, I doubt PE I have low suspicion for ACS I have low suspicion for dissection I also doubt pericarditis/myocarditis given  history Discussed return precautions  Labs Review Labs Reviewed  BASIC METABOLIC PANEL  CBC  I-STAT TROPOININ, ED    Imaging Review Dg Chest 2 View  08/03/2015  CLINICAL DATA:  Right-sided chest pain since last night. History of asthma. EXAM: CHEST  2 VIEW COMPARISON:  11/08/2005 FINDINGS: Normal heart size and mediastinal contours. No acute infiltrate or edema. No effusion  or pneumothorax. No acute osseous findings. IMPRESSION: Negative chest. Electronically Signed   By: Marnee SpringJonathon  Watts M.D.   On: 08/03/2015 03:17   I have personally reviewed and evaluated these images and lab results as part of my medical decision-making.   EKG Interpretation   Date/Time:  Tuesday August 03 2015 03:02:04 EST Ventricular Rate:  66 PR Interval:  186 QRS Duration: 84 QT Interval:  382 QTC Calculation: 400 R Axis:   88 Text Interpretation:  Normal sinus rhythm with sinus arrhythmia Minimal  voltage criteria for LVH, may be normal variant ST elevation, consider  early repolarization Borderline ECG Confirmed by Bebe ShaggyWICKLINE  MD, Dorinda HillNALD  [phone]([id number]) on 08/03/2015 3:03:32 AM      MDM   Final diagnoses:  Chest pain, unspecified chest pain type    Nursing notes including past medical history and social history reviewed and considered in documentation xrays/imaging reviewed by myself and considered during evaluation Labs/vital reviewed myself and considered during evaluation     Zadie Rhineonald Ottis Vacha, MD 08/03/15 [phone]

## 2015-09-11 ENCOUNTER — Emergency Department (HOSPITAL_COMMUNITY)
Admission: EM | Admit: 2015-09-11 | Discharge: 2015-09-11 | Disposition: A | Discharge status: Home / Self Care | Payer: Self-pay | Attending: Emergency Medicine | Admitting: Emergency Medicine

## 2015-09-11 ENCOUNTER — Encounter (HOSPITAL_COMMUNITY): Payer: Self-pay | Admitting: Emergency Medicine

## 2015-09-11 DIAGNOSIS — H6092 Unspecified otitis externa, left ear: Secondary | ICD-10-CM | POA: Insufficient documentation

## 2015-09-11 DIAGNOSIS — J45909 Unspecified asthma, uncomplicated: Secondary | ICD-10-CM | POA: Insufficient documentation

## 2015-09-11 MED ORDER — CIPROFLOXACIN-DEXAMETHASONE 0.3-0.1 % OT SUSP: 4.0000 [drp] | Freq: Two times a day (BID) | OTIC | Status: DC

## 2015-09-11 MED ORDER — IBUPROFEN 800 MG PO TABS: 800.0000 mg | ORAL_TABLET | Freq: Three times a day (TID) | ORAL | Status: DC | PRN

## 2015-09-11 NOTE — Discharge Instructions (Signed)
Return here as needed.  Follow up with a primary care doctor °

## 2015-09-11 NOTE — ED Notes (Signed)
Pt able to ambulate independently 

## 2015-09-11 NOTE — ED Provider Notes (Signed)
CSN: [id number]     Arrival date & time 09/11/15  2008 History   First MD Initiated Contact with Patient 09/11/15 2017     Chief Complaint  Patient presents with  . Otalgia     (Consider location/radiation/quality/duration/timing/severity/associated sxs/prior Treatment) HPI Patient presents to the emergency department with complaints of left ear canal pain.  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  Patient also states that the pain started 1 week ago.  He has had this bump behind his ear.  His entire life.  He is asking if we can examine it.  Patient denies fever, nausea, vomiting, headache, blurred vision, back pain, neck pain, chest pain, shortness of breath, weakness, or syncope.  The patient states that he did not take any medications prior to arrival.  He did put olive oil in his left ear canal Past Medical History  Diagnosis Date  . Asthma    History reviewed. No pertinent past surgical history. History reviewed. No pertinent family history. Social History  Substance Use Topics  . Smoking status: Never Smoker   . Smokeless tobacco: None  . Alcohol Use: Yes     Comment: socially    Review of Systems  All other systems negative except as documented in the HPI. All pertinent positives and negatives as reviewed in the HPI.  Allergies  Review of patient's allergies indicates no known allergies.  Home Medications   Prior to Admission medications   Medication Sig Start Date End Date Taking? Authorizing Provider  Aspirin-Caffeine (BC FAST PAIN RELIEF PO) Take 1 packet by mouth 2 (two) times daily as needed. For pain    Historical Provider, MD  benzocaine (ORAJEL) 10 % mucosal gel Use as directed 1 application in the mouth or throat 3 (three) times daily as needed. For mouth pain    Historical Provider, MD  ibuprofen (ADVIL,MOTRIN) 600 MG tablet Take 1 tablet (600 mg total) by mouth every 6 (six) hours as needed. 08/03/15   Zadie Rhine, MD  penicillin v potassium (VEETID) 500 MG tablet Take 1 tablet (500 mg total) by mouth 3 (three) times daily. X 7 days 07/17/15   Mercedes Camprubi-Soms, PA-C   BP 113/64 mmHg  Pulse 66  Temp(Src) 97.8 F (36.6 C) (Oral)  Resp 18  Wt 70.024 kg  SpO2 100% Physical Exam  Constitutional: He is oriented to person, place, and time. He appears well-developed and well-nourished. No distress.  HENT:  Head: Normocephalic and atraumatic.  Right Ear: Tympanic membrane and ear canal normal.  Left Ear: Tympanic membrane normal.  Ears:  Mouth/Throat: Oropharynx is clear and moist.  Eyes: Pupils are equal, round, and reactive to light.  Neck: Normal range of motion. Neck supple.  Cardiovascular: Normal rate, regular rhythm and normal heart sounds.  Exam reveals no gallop and no friction rub.   No murmur heard. Pulmonary/Chest: Effort normal and breath sounds normal. No respiratory distress. He has no wheezes.  Neurological: He is alert and oriented to person, place, and time. He exhibits normal muscle tone. Coordination normal.  Skin: Skin is warm and dry. No rash noted. No erythema.  Psychiatric: He has a normal mood and affect. His behavior is normal.  Nursing note and vitals reviewed.   ED Course  Procedures (including critical care time) Labs Review Labs Reviewed - No data to display  Imaging Review No results found. I have personally reviewed and evaluated these images and lab results as  part of my medical decision-making.   EKG Interpretation None        Patient be treated for irritation of his left auditory canal.  He does have this area behind the left ear that is a cystic like structure that he states is been there for quite a while since early childhood.  Patient is advised return here as needed and advised follow-up with ENT  Charlestine Nighthristopher Evett Kassa, PA-C 09/12/15 2328  Gerhard Munchobert Lockwood, MD 09/12/15 [phone]

## 2015-09-11 NOTE — ED Notes (Signed)
Pt presents to ED for assessment of ear pain on the left side.  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  Denies discharge.  Pt states pain started approx 1 week ago.

## 2016-03-20 ENCOUNTER — Emergency Department (HOSPITAL_COMMUNITY): Payer: Self-pay

## 2016-03-20 ENCOUNTER — Encounter (HOSPITAL_COMMUNITY): Payer: Self-pay | Admitting: Emergency Medicine

## 2016-03-20 ENCOUNTER — Emergency Department (HOSPITAL_COMMUNITY)
Admission: EM | Admit: 2016-03-20 | Discharge: 2016-03-20 | Disposition: A | Discharge status: Home / Self Care | Payer: Self-pay | Attending: Emergency Medicine | Admitting: Emergency Medicine

## 2016-03-20 DIAGNOSIS — S0011XA Contusion of right eyelid and periocular area, initial encounter: Secondary | ICD-10-CM

## 2016-03-20 DIAGNOSIS — J45909 Unspecified asthma, uncomplicated: Secondary | ICD-10-CM | POA: Insufficient documentation

## 2016-03-20 DIAGNOSIS — H578 Other specified disorders of eye and adnexa: Secondary | ICD-10-CM | POA: Insufficient documentation

## 2016-03-20 DIAGNOSIS — S025XXA Fracture of tooth (traumatic), initial encounter for closed fracture: Secondary | ICD-10-CM | POA: Insufficient documentation

## 2016-03-20 DIAGNOSIS — Y9289 Other specified places as the place of occurrence of the external cause: Secondary | ICD-10-CM | POA: Insufficient documentation

## 2016-03-20 DIAGNOSIS — Y939 Activity, unspecified: Secondary | ICD-10-CM | POA: Insufficient documentation

## 2016-03-20 DIAGNOSIS — R6884 Jaw pain: Secondary | ICD-10-CM

## 2016-03-20 DIAGNOSIS — Y999 Unspecified external cause status: Secondary | ICD-10-CM | POA: Insufficient documentation

## 2016-03-20 MED ORDER — KETOROLAC TROMETHAMINE 60 MG/2ML IM SOLN
60.0000 mg | Freq: Once | INTRAMUSCULAR | Status: AC
  Administered 2016-03-20: 60 mg via INTRAMUSCULAR
  Filled 2016-03-20: qty 2
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
## 2018-02-05 ENCOUNTER — Other Ambulatory Visit (HOSPITAL_COMMUNITY): Payer: Self-pay | Admitting: Otolaryngology

## 2018-02-05 ENCOUNTER — Other Ambulatory Visit: Payer: Self-pay | Admitting: Otolaryngology

## 2018-02-05 ENCOUNTER — Other Ambulatory Visit: Payer: Self-pay | Admitting: Physical Therapy

## 2018-02-05 DIAGNOSIS — H903 Sensorineural hearing loss, bilateral: Secondary | ICD-10-CM

## 2018-02-28 ENCOUNTER — Other Ambulatory Visit (HOSPITAL_COMMUNITY): Payer: Self-pay | Admitting: Otolaryngology

## 2018-02-28 ENCOUNTER — Ambulatory Visit (HOSPITAL_COMMUNITY)
Admission: RE | Admit: 2018-02-28 | Discharge: 2018-02-28 | Disposition: A | Discharge status: Home / Self Care | Payer: Self-pay | Source: Ambulatory Visit | Attending: Otolaryngology | Admitting: Otolaryngology

## 2018-02-28 DIAGNOSIS — H903 Sensorineural hearing loss, bilateral: Secondary | ICD-10-CM

## 2018-02-28 MED ORDER — IOHEXOL 300 MG/ML  SOLN
75.0000 mL | Freq: Once | INTRAMUSCULAR | Status: AC | PRN
  Administered 2018-02-28: 75 mL via INTRAVENOUS

## 2018-04-08 ENCOUNTER — Encounter (HOSPITAL_COMMUNITY): Payer: Self-pay | Admitting: Emergency Medicine

## 2018-04-08 ENCOUNTER — Emergency Department (HOSPITAL_COMMUNITY)
Admission: EM | Admit: 2018-04-08 | Discharge: 2018-04-08 | Disposition: A | Discharge status: Home / Self Care | Payer: Self-pay | Attending: Emergency Medicine | Admitting: Emergency Medicine

## 2018-04-08 ENCOUNTER — Other Ambulatory Visit: Payer: Self-pay

## 2018-04-08 DIAGNOSIS — J45909 Unspecified asthma, uncomplicated: Secondary | ICD-10-CM | POA: Insufficient documentation

## 2018-04-08 DIAGNOSIS — L42 Pityriasis rosea: Secondary | ICD-10-CM | POA: Insufficient documentation

## 2018-04-08 DIAGNOSIS — L299 Pruritus, unspecified: Secondary | ICD-10-CM

## 2018-04-08 DIAGNOSIS — Z7982 Long term (current) use of aspirin: Secondary | ICD-10-CM | POA: Insufficient documentation

## 2018-04-08 MED ORDER — HYDROXYZINE HCL 25 MG PO TABS: 25.0000 mg | ORAL_TABLET | Freq: Three times a day (TID) | ORAL | 0 refills | Status: AC | PRN

## 2018-04-08 MED ORDER — HYDROCORTISONE 0.5 % EX CREA: 1.0000 "application " | TOPICAL_CREAM | Freq: Two times a day (BID) | CUTANEOUS | 0 refills | Status: DC

## 2018-04-08 NOTE — ED Triage Notes (Signed)
Pt states he has bumps/rash to flanks and abdomen for 3 days.

## 2018-04-08 NOTE — Discharge Instructions (Addendum)
You have pityriasis rosea, which is a non-harmful rash caused by a common virus that most humans are exposed to. It is not contagious. The rash should go away in the next few weeks. In the meantime, you can apply hydrocortisone cream to the itchiest areas twice per day as needed. You can also take the medication hydroxyzine as needed for itching up to 3 times per day. Sunshine also helps this rash. This medication is similar to benadryl and may make you feel sleepy. If the rash does not go away, please f/u with your PCP.

## 2018-04-08 NOTE — ED Notes (Signed)
Declined W/C at D/C and was escorted to lobby by RN. 

## 2018-04-08 NOTE — ED Provider Notes (Signed)
[facility] EMERGENCY DEPARTMENT Provider Note   CSN: [id number] Arrival date & time: 04/08/18  4098   History   Chief Complaint Chief Complaint  Patient presents with  . Rash    HPI Raymond Orozco is a 27 y.o. male with a medical history of single-sided hearing and asthma who presents to the ED with an itchy rash on his arms, back, and torso that has been present for 3-4 days. He reports that the rash came on all at once and he does not think he's getting new lesions. He has tried benadryl which temporarily relieves the itching and oatmeal baths. He is concerned that he has scabies because his niece, whom he lives with, was treated for scabies 4 months ago. He denies any fevers, chills, new medications, and new topical exposures.   Past Medical History:  Diagnosis Date  . Asthma     There are no active problems to display for this patient.   No past surgical history on file.      Home Medications    Prior to Admission medications   Medication Sig Start Date End Date Taking? Authorizing Provider  Aspirin-Caffeine (BC FAST PAIN RELIEF PO) Take 1 packet by mouth 2 (two) times daily as needed. For pain    [provider]  hydrocortisone cream 0.5 % Apply 1 application topically 2 (two) times daily. 04/08/18   Raeshawn Tafolla, Cathleen Corti, MD  hydrOXYzine (ATARAX/VISTARIL) 25 MG tablet Take 1 tablet (25 mg total) by mouth 3 (three) times daily as needed for up to 7 days for itching. 04/08/18 04/15/18  Arsema Tusing, Cathleen Corti, MD    Family History No family history on file.  Social History Social History   Tobacco Use  . Smoking status: Never Smoker  Substance Use Topics  . Alcohol use: Yes    Comment: socially  . Drug use: No     Allergies   Patient has no known allergies.   Review of Systems Review of Systems  Constitutional: Negative for chills and fever.  HENT: Negative for rhinorrhea and sore throat.   Eyes: Negative for visual disturbance.    Respiratory: Negative for cough and shortness of breath.   Cardiovascular: Negative for chest pain and leg swelling.  Gastrointestinal: Negative for abdominal pain, nausea and vomiting.  Genitourinary: Negative for difficulty urinating, discharge, dysuria and genital sores.  Musculoskeletal: Negative for back pain and neck pain.  Skin: Positive for rash. Negative for wound.       Pruritus  Neurological: Negative for weakness and headaches.     Physical Exam Updated Vital Signs BP 126/82   Pulse 72   Temp 98.3 F (36.8 C) (Oral)   Resp 18   Ht 5\' 5"  (1.651 m)   Wt 75.8 kg (167 lb)   SpO2 100%   BMI 27.79 kg/m   Physical Exam  Constitutional: He is oriented to person, place, and time. He appears well-developed and well-nourished. No distress.  HENT:  Head: Normocephalic and atraumatic.  Eyes: Pupils are equal, round, and reactive to light. EOM are normal.  Neck: Normal range of motion. Neck supple.  Abdominal: Soft. He exhibits no distension. There is no tenderness.  Genitourinary: Penis normal. No penile tenderness.  Genitourinary Comments: No genital rash involvement or genital lesions.  Musculoskeletal: Normal range of motion. He exhibits no edema or deformity.  Neurological: He is alert and oriented to person, place, and time.  Skin: Skin is warm and dry. Capillary refill takes less than  2 seconds.  Pink plaques of varying sizes without scale are diffusely scattered on the trunk, back, and upper arms. Herald patch located on right upper abdomen. No palm or sole involvement. No interdigital furrows.  Psychiatric: He has a normal mood and affect. His behavior is normal.     ED Treatments / Results  Labs (all labs ordered are listed, but only abnormal results are displayed) Labs Reviewed - No data to display  EKG None  Radiology No results found.  Procedures Procedures (including critical care time)  Medications Ordered in ED Medications - No data to  display   Initial Impression / Assessment and Plan / ED Course  I have reviewed the triage vital signs and the nursing notes.  Pertinent labs & imaging results that were available during my care of the patient were reviewed by me and considered in my medical decision making (see chart for details).  Raymond Orozco is a 27 y.o. male with a medical history of single-sided hearing and asthma who presents to the ED with a scattered pruritic pink plaque eruption on his torso, back, and upper arms with a herald patch characteristic of pityriasis rosea. Upon arrival to the ED, patient was afebrile and hemodynamically stable. Based on clinical appearance of rash, pityriasis rosea is suspected. Pt safe for discharge home with topical hydrocortisone and hydroxyzine for pruritus. Pt advised to return to the ED with fevers, spreading rash, or any other new or concerning symptoms.  Final Clinical Impressions(s) / ED Diagnoses   Final diagnoses:  Pityriasis rosea  Pruritus    ED Discharge Orders        Ordered    hydrocortisone cream 0.5 %  2 times daily     04/08/18 0919    hydrOXYzine (ATARAX/VISTARIL) 25 MG tablet  3 times daily PRN     04/08/18 0919       Isis Costanza, Cathleen Cortieborah N, MD 04/08/18 1025    Melene PlanFloyd, Dan, DO 04/08/18 1137

## 2018-06-27 ENCOUNTER — Emergency Department (HOSPITAL_COMMUNITY)
Admission: EM | Admit: 2018-06-27 | Discharge: 2018-06-27 | Disposition: A | Discharge status: Home / Self Care | Payer: Self-pay | Attending: Emergency Medicine | Admitting: Emergency Medicine

## 2018-06-27 ENCOUNTER — Encounter (HOSPITAL_COMMUNITY): Payer: Self-pay

## 2018-06-27 ENCOUNTER — Other Ambulatory Visit: Payer: Self-pay

## 2018-06-27 DIAGNOSIS — Z79899 Other long term (current) drug therapy: Secondary | ICD-10-CM | POA: Insufficient documentation

## 2018-06-27 DIAGNOSIS — J45909 Unspecified asthma, uncomplicated: Secondary | ICD-10-CM | POA: Insufficient documentation

## 2018-06-27 DIAGNOSIS — K029 Dental caries, unspecified: Secondary | ICD-10-CM | POA: Insufficient documentation

## 2018-06-27 MED ORDER — NAPROXEN 500 MG PO TABS: 500.0000 mg | ORAL_TABLET | Freq: Two times a day (BID) | ORAL | 0 refills | Status: DC

## 2018-06-27 MED ORDER — TRAMADOL HCL 50 MG PO TABS: 50.0000 mg | ORAL_TABLET | Freq: Four times a day (QID) | ORAL | 0 refills | Status: DC | PRN

## 2018-06-27 MED ORDER — AMOXICILLIN 500 MG PO CAPS: 500.0000 mg | ORAL_CAPSULE | Freq: Three times a day (TID) | ORAL | 0 refills | Status: DC

## 2018-06-27 NOTE — ED Triage Notes (Signed)
Pt reports rt sided dental pain that started over the weekend that has gotten worse. Pt reports rt ear pain and facial swelling. Pt reports pain medications have not been helping anymore. Denies any oral trauma.

## 2018-06-27 NOTE — ED Triage Notes (Deleted)
Pt states he took a walk earlier today and exacerbated his asthma. Pt states hx of same, reports using inhaler twice overnight without improvement.

## 2018-06-27 NOTE — Discharge Instructions (Signed)
You have been seen by your caregiver because of dental pain.  °SEEK MEDICAL ATTENTION IF: °The exam and treatment you received today has been provided on an emergency basis only. This is not a substitute for complete medical or dental care. If your problem worsens or new symptoms (problems) appear, and you are unable to arrange prompt follow-up care with your dentist, call or return to this location. °CALL YOUR DENTIST OR RETURN IMMEDIATELY IF you develop a fever, rash, difficulty breathing or swallowing, neck or facial swelling, or other potentially serious concerns. ° ° °[employer]  °[employer]  °[facility]  °[address]  °[place],  [place] [address]  °Phone [phone]  °[facility] [employer] [facility] in [place], [place], exemplifies the Dental School?s vision to improve the health and quality of life of all [place] by creating leaders with a passion to care for the underserved and by leading the nation in community-based, service learning oral health education. °We are committed to offering comprehensive general dental services for adults, children and special needs patients in a safe, caring and professional setting. ° °Appointments: Our clinic is open Monday through Friday 8:00 a.m. until 5:00 p.m. The amount of time scheduled for an appointment depends on the patient?s specific needs. We ask that you keep your appointed time for care or provide 24-hour notice of all appointment changes. Parents or legal guardians must accompany minor children. ° °Payment for Services: Medicaid and other insurance plans are welcome. Payment for services is due when services are rendered and may be made by cash or credit card. If you have dental insurance, we will assist you with your claim submission.  ° °Emergencies: Emergency services will be provided Monday through Friday on a  walk-in basis. Please arrive early for emergency services. After hours emergency services will be provided for patients of record as required. ° °Services:  °Comprehensive General Dentistry  °Children?s Dentistry  °Oral Surgery - Extractions  °Root Canals  °Sealants and Tooth Colored Fillings  °Crowns and Bridges  °Dentures and Partial Dentures  °Implant Services  °Periodontal Services and Cleanings  °Cosmetic Tooth Whitening  °Digital Radiography  °3-D/[facility] Imaging ° ° °

## 2018-06-27 NOTE — ED Provider Notes (Signed)
[facility] EMERGENCY DEPARTMENT Provider Note   CSN: [id number] Arrival date & time: 06/27/18  1324     History   Chief Complaint Chief Complaint  Patient presents with  . Dental Pain    HPI Raymond Orozco is a 27 y.o. male with a past medical history of dental caries who presents the emergency department with chief complaint of tooth pain.  Patient states that he has had on and off pain in his upper and lower right second molars for the past week or 2.  Over the past 48 hours the pain has become constant, worsening, worse with any pressure.  He is unable to eat on that side.  He denies any shortness of breath, difficulty swallowing, trismus, change in voice or fevers   HPI  Past Medical History:  Diagnosis Date  . Asthma     There are no active problems to display for this patient.   History reviewed. No pertinent surgical history.      Home Medications    Prior to Admission medications   Medication Sig Start Date End Date Taking? Authorizing Provider  amoxicillin (AMOXIL) 500 MG capsule Take 1 capsule (500 mg total) by mouth 3 (three) times daily. 06/27/18   Arthor Captain, PA-C  hydrocortisone cream 0.5 % Apply 1 application topically 2 (two) times daily. 04/08/18   Dorrell, Cathleen Corti, MD  naproxen (NAPROSYN) 500 MG tablet Take 1 tablet (500 mg total) by mouth 2 (two) times daily. 06/27/18   Dorla Guizar, Cammy Copa, PA-C  traMADol (ULTRAM) 50 MG tablet Take 1 tablet (50 mg total) by mouth every 6 (six) hours as needed. 06/27/18   Arthor Captain, PA-C    Family History History reviewed. No pertinent family history.  Social History Social History   Tobacco Use  . Smoking status: Never Smoker  . Smokeless tobacco: Never Used  Substance Use Topics  . Alcohol use: Yes    Comment: socially  . Drug use: No     Allergies   Patient has no known allergies.   Review of Systems Review of Systems Ten systems reviewed and are negative for acute  change, except as noted in the HPI. \  Physical Exam Updated Vital Signs BP 120/88 (BP Location: Right Arm)   Pulse 65   Temp 98.2 F (36.8 C) (Oral)   Resp 16   Ht 5\' 2"  (1.575 m)   Wt 74.8 kg   SpO2 100%   BMI 30.18 kg/m   Physical Exam  Constitutional: He appears well-developed and well-nourished. No distress.  HENT:  Head: Normocephalic and atraumatic.  Mouth/Throat: Uvula is midline, oropharynx is clear and moist and mucous membranes are normal. Dental caries present. No dental abscesses.    Eyes: Conjunctivae are normal. No scleral icterus.  Neck: Normal range of motion. Neck supple.  Cardiovascular: Normal rate, regular rhythm and normal heart sounds.  Pulmonary/Chest: Effort normal and breath sounds normal. No respiratory distress.  Abdominal: Soft. There is no tenderness.  Musculoskeletal: He exhibits no edema.  Neurological: He is alert.  Skin: Skin is warm and dry. He is not diaphoretic.  Psychiatric: His behavior is normal.  Nursing note and vitals reviewed.    ED Treatments / Results  Labs (all labs ordered are listed, but only abnormal results are displayed) Labs Reviewed - No data to display  EKG None  Radiology No results found.  Procedures Procedures (including critical care time)  Medications Ordered in ED Medications - No data to display  Initial Impression / Assessment and Plan / ED Course  I have reviewed the triage vital signs and the nursing notes.  Pertinent labs & imaging results that were available during my care of the patient were reviewed by me and considered in my medical decision making (see chart for details).     Patient with dentalgia.  No abscess requiring immediate incision and drainage.  Exam not concerning for Ludwig's angina or pharyngeal abscess.  . Pt instructed to follow-up with dentist.  Discussed return precautions. Pt safe for discharge.   Final Clinical Impressions(s) / ED Diagnoses   Final diagnoses:    Pain due to dental caries    ED Discharge Orders         Ordered    amoxicillin (AMOXIL) 500 MG capsule  3 times daily     06/27/18 0810    naproxen (NAPROSYN) 500 MG tablet  2 times daily     06/27/18 0810    traMADol (ULTRAM) 50 MG tablet  Every 6 hours PRN     06/27/18 0810           Arthor Captain, PA-C 06/27/18 0852    Little, Ambrose Finland, MD 06/28/18 1530

## 2018-06-28 DIAGNOSIS — R112 Nausea with vomiting, unspecified: Secondary | ICD-10-CM | POA: Insufficient documentation

## 2018-06-28 DIAGNOSIS — K0889 Other specified disorders of teeth and supporting structures: Secondary | ICD-10-CM | POA: Insufficient documentation

## 2018-06-28 DIAGNOSIS — Z5321 Procedure and treatment not carried out due to patient leaving prior to being seen by health care provider: Secondary | ICD-10-CM | POA: Insufficient documentation

## 2018-06-28 DIAGNOSIS — R1084 Generalized abdominal pain: Secondary | ICD-10-CM | POA: Insufficient documentation

## 2018-06-29 ENCOUNTER — Other Ambulatory Visit: Payer: Self-pay

## 2018-06-29 ENCOUNTER — Emergency Department (HOSPITAL_COMMUNITY)
Admission: EM | Admit: 2018-06-29 | Discharge: 2018-06-29 | Disposition: A | Discharge status: Home / Self Care | Payer: Self-pay | Attending: Emergency Medicine | Admitting: Emergency Medicine

## 2018-06-29 ENCOUNTER — Encounter (HOSPITAL_COMMUNITY): Payer: Self-pay | Admitting: Emergency Medicine

## 2018-06-29 NOTE — ED Notes (Signed)
Pt came to nurse first stating he wanted to leave. This EMT convinced pt to stay for another an additional hour because we had rooms coming open. Pt stated he could not wait any longer. Pt's armband cut off by Conor, EMT and pt noted to leave lobby via front doors.

## 2018-06-29 NOTE — ED Triage Notes (Addendum)
Patient states he was seen in ED yesterday for dental pain.  C/o nausea, vomiting, and generalized abd pain since taking medications for dental pain.  Denies known allergies.

## 2018-06-29 NOTE — ED Notes (Signed)
Pt approached EMT in lobby and wants to leave. Pt comes to this EMT to turn in labels. Pt encouraged to stay and asked to wait to speak to a provider/RN before leaving. Pt refuses this process. Allows armband to be cut off. Pt seen walking out lobby. To parking lot.

## 2019-03-09 ENCOUNTER — Other Ambulatory Visit: Payer: Self-pay

## 2019-03-09 ENCOUNTER — Encounter (HOSPITAL_COMMUNITY): Payer: Self-pay | Admitting: Emergency Medicine

## 2019-03-09 ENCOUNTER — Emergency Department (HOSPITAL_COMMUNITY)
Admission: EM | Admit: 2019-03-09 | Discharge: 2019-03-09 | Disposition: A | Discharge status: Home / Self Care | Payer: Self-pay | Attending: Emergency Medicine | Admitting: Emergency Medicine

## 2019-03-09 DIAGNOSIS — J45909 Unspecified asthma, uncomplicated: Secondary | ICD-10-CM | POA: Insufficient documentation

## 2019-03-09 DIAGNOSIS — K529 Noninfective gastroenteritis and colitis, unspecified: Secondary | ICD-10-CM | POA: Insufficient documentation

## 2019-03-09 DIAGNOSIS — R112 Nausea with vomiting, unspecified: Secondary | ICD-10-CM | POA: Insufficient documentation

## 2019-03-09 LAB — COMPREHENSIVE METABOLIC PANEL
ALT: 54 U/L — ABNORMAL HIGH (ref 0–44)
AST: 37 U/L (ref 15–41)
Albumin: 5.5 g/dL — ABNORMAL HIGH (ref 3.5–5.0)
Alkaline Phosphatase: 111 U/L (ref 38–126)
Anion gap: 11 (ref 5–15)
BUN: 18 mg/dL (ref 6–20)
CO2: 24 mmol/L (ref 22–32)
Calcium: 10.1 mg/dL (ref 8.9–10.3)
Chloride: 102 mmol/L (ref 98–111)
Creatinine, Ser: 1.44 mg/dL — ABNORMAL HIGH (ref 0.61–1.24)
GFR calc Af Amer: >60 mL/min (ref >60)
GFR calc non Af Amer: >60 mL/min (ref >60)
Glucose, Bld: 117 mg/dL — ABNORMAL HIGH (ref 70–99)
Potassium: 4.5 mmol/L (ref 3.5–5.1)
Sodium: 137 mmol/L (ref 135–145)
Total Bilirubin: 1 mg/dL (ref 0.3–1.2)
Total Protein: 9.4 g/dL — ABNORMAL HIGH (ref 6.5–8.1)

## 2019-03-09 LAB — CBC
HCT: >55 % — ABNORMAL HIGH (ref 39.0–52.0)
Hemoglobin: 18.7 g/dL — ABNORMAL HIGH (ref 13.0–17.0)
MCH: 26.3 pg (ref 26.0–34.0)
MCHC: 31.7 g/dL (ref 30.0–36.0)
MCV: 83 fL (ref 80.0–100.0)
Platelets: 250 10*3/uL (ref 150–400)
RBC: 7.1 MIL/uL — ABNORMAL HIGH (ref 4.22–5.81)
RDW: 13.6 % (ref 11.5–15.5)
WBC: 13.4 10*3/uL — ABNORMAL HIGH (ref 4.0–10.5)
nRBC: 0 % (ref 0.0–0.2)

## 2019-03-09 LAB — LIPASE, BLOOD: Lipase: 23 U/L (ref 11–51)

## 2019-03-09 MED ORDER — SODIUM CHLORIDE 0.9% FLUSH: 3.0000 mL | Freq: Once | INTRAVENOUS | Status: DC

## 2019-03-09 MED ORDER — ONDANSETRON HCL 4 MG/2ML IJ SOLN
4.0000 mg | Freq: Once | INTRAMUSCULAR | Status: AC
  Administered 2019-03-09: 4 mg via INTRAVENOUS
  Filled 2019-03-09: qty 2

## 2019-03-09 MED ORDER — ONDANSETRON 4 MG PO TBDP: 4.0000 mg | ORAL_TABLET | Freq: Once | ORAL | Status: DC | PRN

## 2019-03-09 MED ORDER — SODIUM CHLORIDE 0.9 % IV BOLUS
1000.0000 mL | Freq: Once | INTRAVENOUS | Status: AC
  Administered 2019-03-09: 03:00:00 1000 mL via INTRAVENOUS

## 2019-03-09 MED ORDER — ONDANSETRON 8 MG PO TBDP: ORAL_TABLET | ORAL | 0 refills | Status: AC

## 2019-03-09 MED ORDER — KETOROLAC TROMETHAMINE 30 MG/ML IJ SOLN
30.0000 mg | Freq: Once | INTRAMUSCULAR | Status: AC
  Administered 2019-03-09: 03:00:00 30 mg via INTRAVENOUS
  Filled 2019-03-09: qty 1

## 2019-03-09 NOTE — Discharge Instructions (Signed)
Zofran as prescribed as needed for nausea.  Clear liquid diet for the next 12 hours, then slowly advance as tolerated.  Return to the emergency department for worsening pain, high fever, bloody stool, or other new and concerning symptoms.

## 2019-03-09 NOTE — ED Notes (Signed)
Pt will not remain still making it difficult to obtain vitals. Pt shouting "it hurts it hurts". Pt was calm and collected in the lobby while waiting.

## 2019-03-09 NOTE — ED Provider Notes (Signed)
[facility]-EMERGENCY DEPT Provider Note   CSN: [id number] Arrival date & time: 03/09/19  0145     History   Chief Complaint Chief Complaint  Patient presents with  . Emesis    HPI Raymond Orozco is a 28 y.o. male.     Patient is a 28 year old male with past medical history of asthma.  He presents today with complaints of abdominal cramping, nausea, and vomiting.  This started earlier this evening after eating [place] food.  He describes severe crampy abdominal pain that is most pronounced in the epigastric region.  He has had no diarrhea.  All of his vomiting has been nonbloody.  He denies ill contacts.  The history is provided by the patient.  Emesis Severity:  Severe Duration:  3 hours Timing:  Constant Quality:  Stomach contents Progression:  Worsening Chronicity:  New Relieved by:  Nothing Worsened by:  Nothing Ineffective treatments:  None tried Associated symptoms: abdominal pain   Associated symptoms: no chills and no fever     Past Medical History:  Diagnosis Date  . Asthma     There are no active problems to display for this patient.   History reviewed. No pertinent surgical history.      Home Medications    Prior to Admission medications   Medication Sig Start Date End Date Taking? Authorizing Provider  amoxicillin (AMOXIL) 500 MG capsule Take 1 capsule (500 mg total) by mouth 3 (three) times daily. 06/27/18   Arthor CaptainHarris, Abigail, PA-C  hydrocortisone cream 0.5 % Apply 1 application topically 2 (two) times daily. 04/08/18   Dorrell, Cathleen Cortieborah N, MD  naproxen (NAPROSYN) 500 MG tablet Take 1 tablet (500 mg total) by mouth 2 (two) times daily. 06/27/18   Harris, Cammy CopaAbigail, PA-C  traMADol (ULTRAM) 50 MG tablet Take 1 tablet (50 mg total) by mouth every 6 (six) hours as needed. 06/27/18   Arthor CaptainHarris, Abigail, PA-C    Family History History reviewed. No pertinent family history.  Social History Social History   Tobacco Use  . Smoking  status: Never Smoker  . Smokeless tobacco: Never Used  Substance Use Topics  . Alcohol use: Yes    Comment: socially  . Drug use: No     Allergies   Patient has no known allergies.   Review of Systems Review of Systems  Constitutional: Negative for chills and fever.  Gastrointestinal: Positive for abdominal pain and vomiting.  All other systems reviewed and are negative.    Physical Exam Updated Vital Signs BP (!) 141/121 (BP Location: Left Arm)   Pulse (!) 125   Resp 18   Ht 5\' 5"  (1.651 m)   Wt 72.6 kg   SpO2 100%   BMI 26.63 kg/m   Physical Exam Vitals signs and nursing note reviewed.  Constitutional:      General: He is not in acute distress.    Appearance: He is well-developed. He is not diaphoretic.  HENT:     Head: Normocephalic and atraumatic.  Neck:     Musculoskeletal: Normal range of motion and neck supple.  Cardiovascular:     Rate and Rhythm: Normal rate and regular rhythm.     Heart sounds: No murmur. No friction rub.  Pulmonary:     Effort: Pulmonary effort is normal. No respiratory distress.     Breath sounds: Normal breath sounds. No wheezing or rales.  Abdominal:     General: Bowel sounds are normal. There is no distension.     Palpations:  Abdomen is soft.     Tenderness: There is abdominal tenderness. There is no guarding or rebound.     Comments: There is mild tenderness in the epigastric region.  There is no rebound or guarding.  Musculoskeletal: Normal range of motion.  Skin:    General: Skin is warm and dry.  Neurological:     Mental Status: He is alert and oriented to person, place, and time.     Coordination: Coordination normal.      ED Treatments / Results  Labs (all labs ordered are listed, but only abnormal results are displayed) Labs Reviewed  LIPASE, BLOOD  COMPREHENSIVE METABOLIC PANEL  CBC  URINALYSIS, ROUTINE W REFLEX MICROSCOPIC    EKG    Radiology No results found.  Procedures Procedures (including  critical care time)  Medications Ordered in ED Medications  sodium chloride flush (NS) 0.9 % injection 3 mL (has no administration in time range)  ondansetron (ZOFRAN-ODT) disintegrating tablet 4 mg (has no administration in time range)  sodium chloride 0.9 % bolus 1,000 mL (has no administration in time range)  ondansetron (ZOFRAN) injection 4 mg (has no administration in time range)  ketorolac (TORADOL) 30 MG/ML injection 30 mg (has no administration in time range)     Initial Impression / Assessment and Plan / ED Course  I have reviewed the triage vital signs and the nursing notes.  Pertinent labs & imaging results that were available during my care of the patient were reviewed by me and considered in my medical decision making (see chart for details).  Patient presenting with complaints of abdominal cramping, nausea, and vomiting.  He believes he may have a gotten food poisoning from takeout foods he recently ordered.  Patient's laboratory studies show a mild white count and electrolytes are otherwise unremarkable.  Patient's presentation is most consistent with a gastroenteritis whether foodborne or viral I am uncertain.  Either way, patient will be discharged with Zofran and clear liquids.  He is to return as needed for any problems.  Final Clinical Impressions(s) / ED Diagnoses   Final diagnoses:  None    ED Discharge Orders    None       Veryl Speak, MD 03/09/19 [id number]

## 2019-03-09 NOTE — ED Triage Notes (Signed)
Pt reports having multiple episodes of vomiting that started last night.

## 2019-07-17 ENCOUNTER — Encounter (INDEPENDENT_AMBULATORY_CARE_PROVIDER_SITE_OTHER): Payer: Self-pay

## 2019-09-05 IMAGING — CT CT TEMPORAL BONES W/ CM
3 of 6 series · 16 of 40 positions shown, 19 images · IV contrast (omnipaque)
Comparison: None.

CLINICAL DATA: Initial evaluation for hearing loss in left ear
since birth.

EXAM:
CT TEMPORAL BONES WITH CONTRAST
TECHNIQUE: Axial and coronal plane CT imaging of the petrous temporal bones was
performed with thin-collimation image reconstruction after
intravenous contrast administration. Multiplanar CT image
reconstructions were also generated.
CONTRAST:  75mL OMNIPAQUE IOHEXOL 300 MG/ML  SOLN

[Series 4: ax soft · axial · 0.35mm/px · z∈[-156,-136]mm · 2 of 42 slices shown]
[im 11/42  brain]
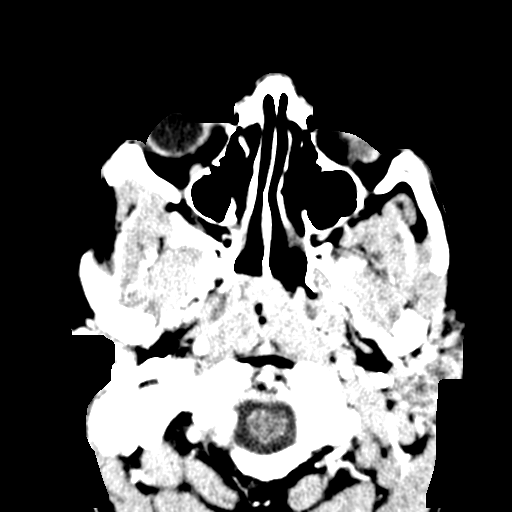
[im 21/42  brain]
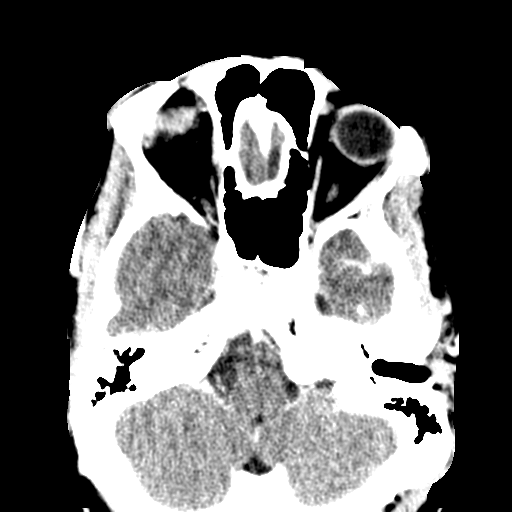

[Series 7: cor mag right · coronal · 0.16mm/px · 2 of 166 slices shown]
[im 56/166  bone]
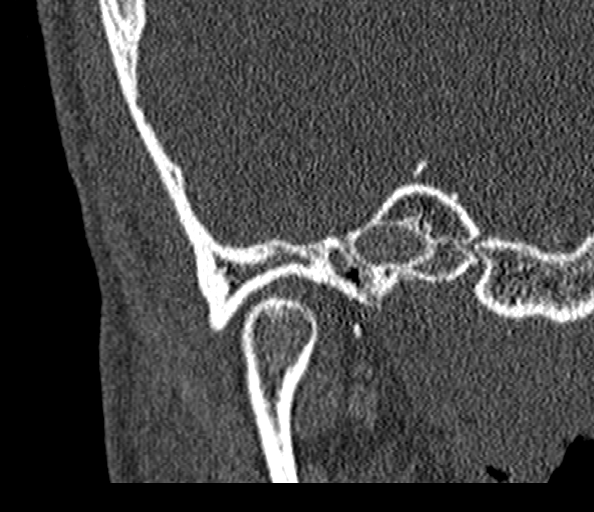
[im 111/166  bone]
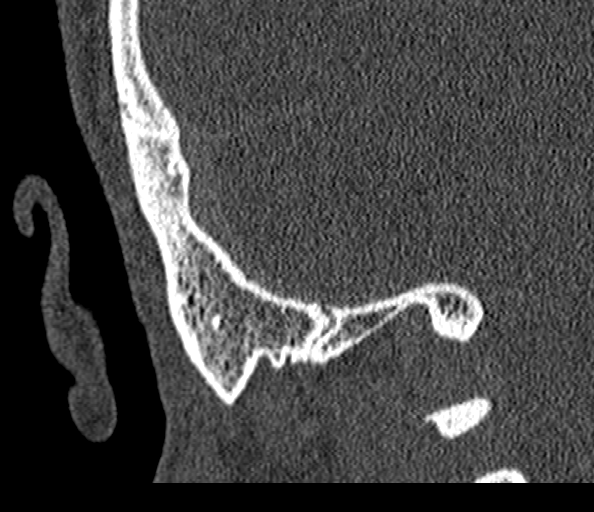

[Series 8: ax mag left · axial · 0.17mm/px · z∈[-166,-107]mm · 12 of 115 slices shown, 15 images]
[im 9/115  brain]
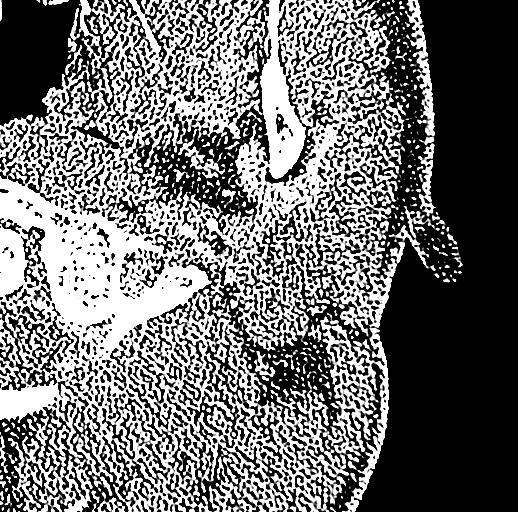
[im 9/115  bone]
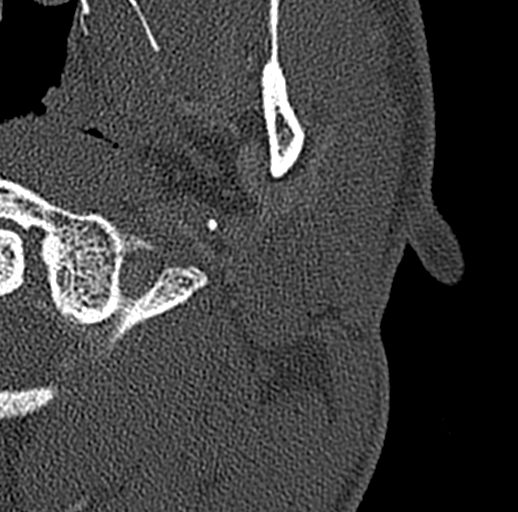
[im 18/115  bone]
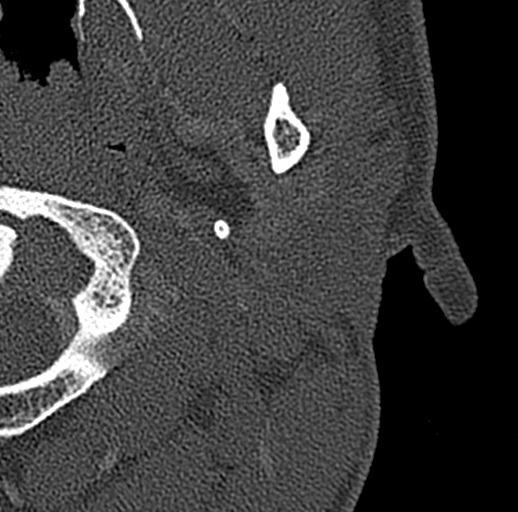
[im 27/115  bone]
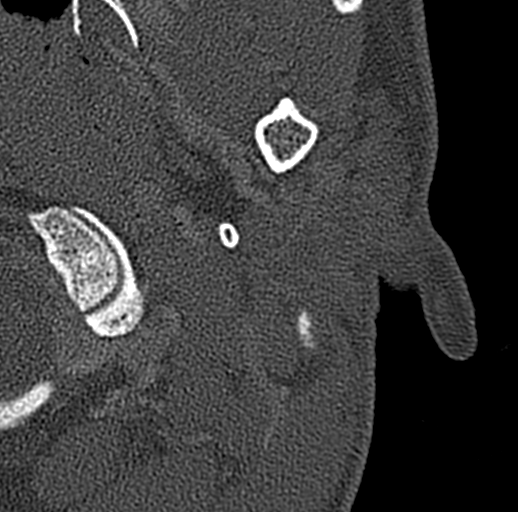
[im 36/115  bone]
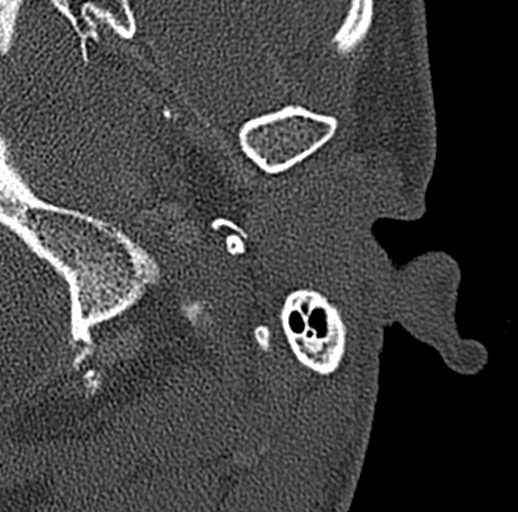
[im 44/115  brain]
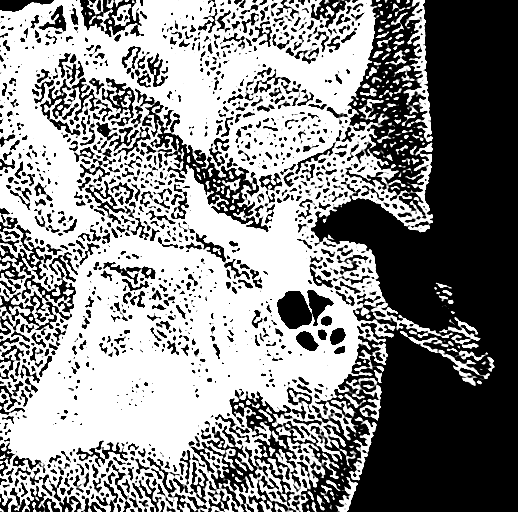
[im 44/115  bone]
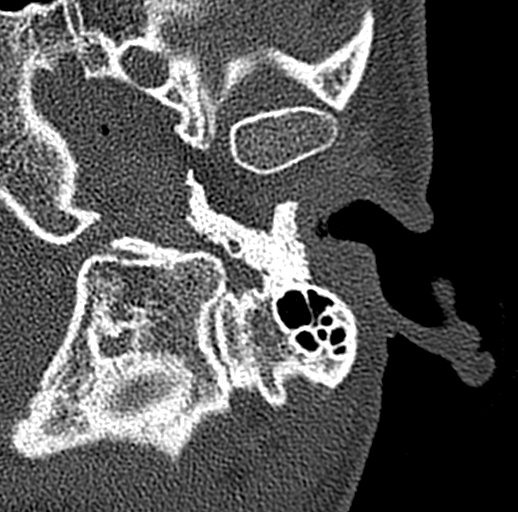
[im 53/115  bone]
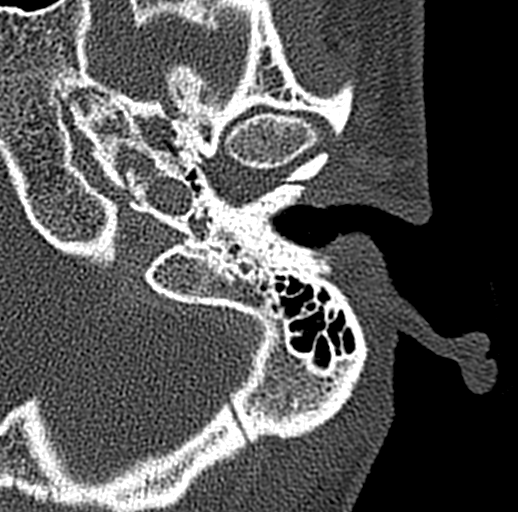
[im 62/115  bone]
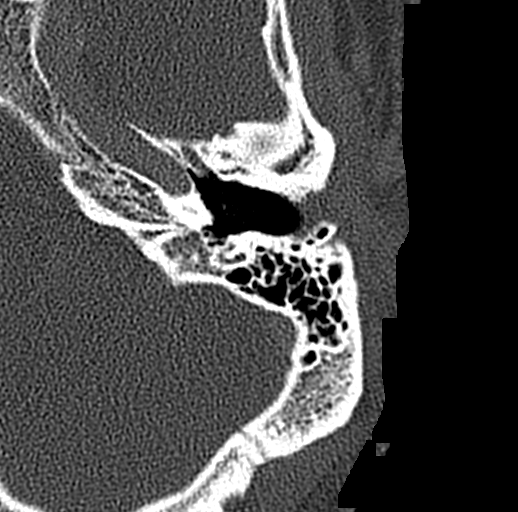
[im 71/115  bone]
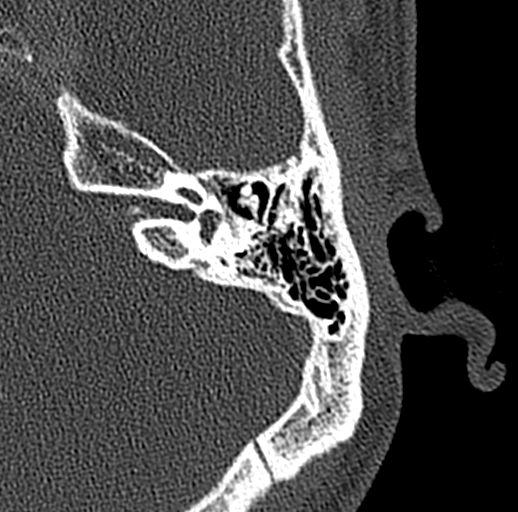
[im 79/115  brain]
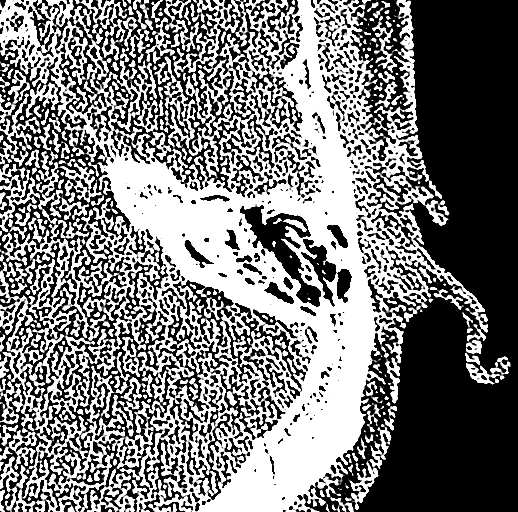
[im 79/115  bone]
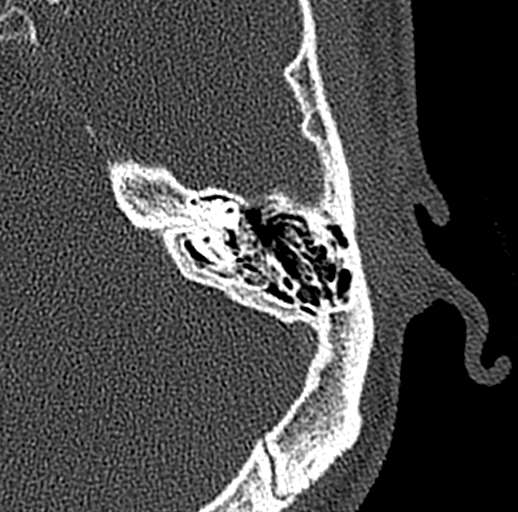
[im 88/115  bone]
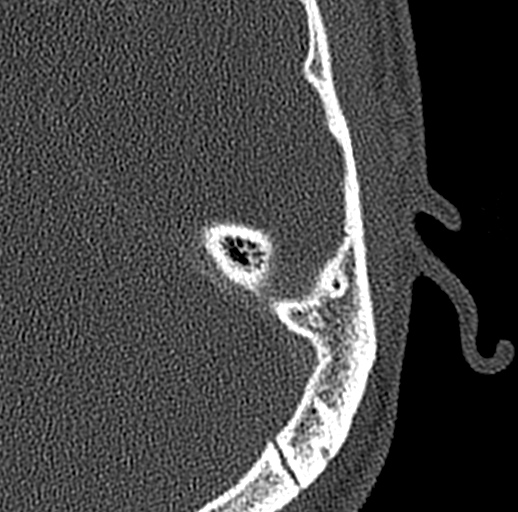
[im 97/115  bone]
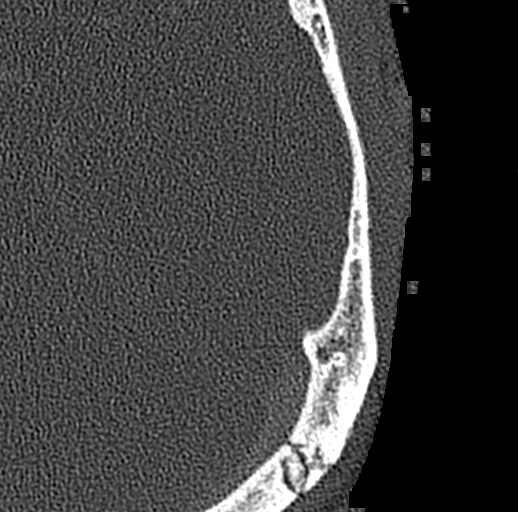
[im 106/115  bone]
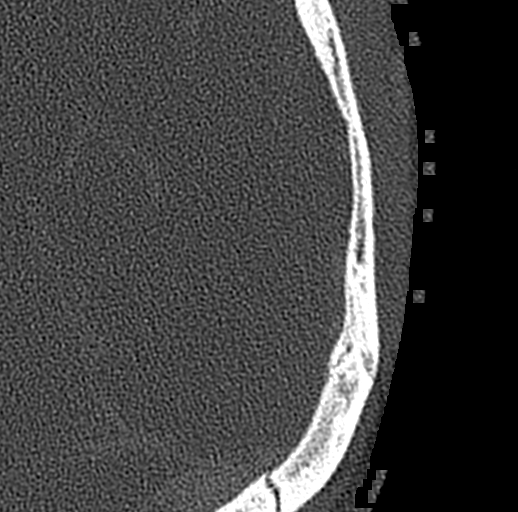

[16 of 40 positions shown; findings below may reference images not displayed]

FINDINGS: Right temporal bone:

The Norya and external auditory canal are unremarkable. The tympanic
membrane is thin. The ossicles are normally formed and aligned.
Mastoid and middle ear is well pneumatized and aerated. The
vestibular structures appear normally formed and bony covered.
Internal auditory canal is normal in size. The vestibular aqueduct
is normal in size. The carotid and sigmoid sinus are bone covered.
Unremarkable facial nerve canal. Vestibular aqueduct within normal
limits.

Left temporal bone:

The Norya and external auditory canal are unremarkable. The tympanic
membrane is thin. The ossicles are normally formed and aligned.
Mastoid and middle ear is well pneumatized and aerated. The
vestibular structures appear normally formed and bony covered.
Internal auditory canal is normal in size. The vestibular aqueduct
is normal in size. The carotid and sigmoid sinus are bone covered.
Unremarkable facial nerve canal. Vestibular aqueduct within normal
limits.
IMPRESSION: Normal temporal bones CT.

## 2021-01-03 ENCOUNTER — Ambulatory Visit (HOSPITAL_COMMUNITY)
Admission: EM | Admit: 2021-01-03 | Discharge: 2021-01-03 | Disposition: A | Discharge status: Home / Self Care | Payer: Self-pay | Attending: Emergency Medicine | Admitting: Emergency Medicine

## 2021-01-03 ENCOUNTER — Encounter (HOSPITAL_COMMUNITY): Payer: Self-pay

## 2021-01-03 ENCOUNTER — Other Ambulatory Visit: Payer: Self-pay

## 2021-01-03 DIAGNOSIS — J45909 Unspecified asthma, uncomplicated: Secondary | ICD-10-CM

## 2021-01-03 HISTORY — DX: Unspecified hearing loss, left ear: H91.92

## 2021-01-03 MED ORDER — CETIRIZINE HCL 10 MG PO TABS: 10.0000 mg | ORAL_TABLET | Freq: Every day | ORAL | 1 refills | Status: DC

## 2021-01-03 MED ORDER — FLUTICASONE PROPIONATE 50 MCG/ACT NA SUSP: 1.0000 | Freq: Every day | NASAL | 1 refills | Status: DC

## 2021-01-03 NOTE — ED Provider Notes (Signed)
[facility]    CSN: [id number] Arrival date & time: 01/03/21  1146      History   Chief Complaint Chief Complaint  Patient presents with  . Ear Fullness  . ASTHMA    HPI Raymond Orozco is a 30 y.o. male.   Patient presents with bilateral ear fullness with decreased hearing on right side, rhinorrhea, sneezing, intermittent shortness of breath and chest tightness. Has intermittent generalized headaches as well. Denies ear pain, discharge or itching, chest pain, fever, chills, sore throat, eye pain or itching. History of asthma Uses albuterol nebulizer as needed. Has not used today.  Past Medical History:  Diagnosis Date  . Asthma   . Deaf, left     There are no problems to display for this patient.   History reviewed. No pertinent surgical history.     Home Medications    Prior to Admission medications   Medication Sig Start Date End Date Taking? Authorizing Provider  cetirizine (ZYRTEC ALLERGY) 10 MG tablet Take 1 tablet (10 mg total) by mouth daily. 01/03/21  Yes Lyzette Reinhardt R, NP  fluticasone (FLONASE) 50 MCG/ACT nasal spray Place 1 spray into both nostrils daily. 01/03/21  Yes Shakya Sebring R, NP  hydrocortisone cream 0.5 % Apply 1 application topically 2 (two) times daily. Patient not taking: Reported on 03/09/2019 04/08/18   Dorrell, Cathleen Corti, MD  ondansetron (ZOFRAN ODT) 8 MG disintegrating tablet 8mg  ODT q4 hours prn nausea 03/09/19   03/11/19, MD    Family History Family History  Family history unknown: Yes    Social History Social History   Tobacco Use  . Smoking status: Never Smoker  . Smokeless tobacco: Never Used  Vaping Use  . Vaping Use: Never used  Substance Use Topics  . Alcohol use: Yes    Comment: socially  . Drug use: No     Allergies   Patient has no known allergies.   Review of Systems Review of Systems  Constitutional: Negative.   HENT: Positive for rhinorrhea and sneezing. Negative for congestion,  dental problem, drooling, ear discharge, ear pain, facial swelling, hearing loss, mouth sores, nosebleeds, postnasal drip, sinus pressure, sinus pain, sore throat, tinnitus, trouble swallowing and voice change.   Eyes: Negative.   Respiratory: Positive for chest tightness and shortness of breath. Negative for apnea, cough, choking, wheezing and stridor.   Cardiovascular: Negative.   Gastrointestinal: Negative.   Skin: Negative.   Neurological: Negative.      Physical Exam Triage Vital Signs ED Triage Vitals [01/03/21 1254]  Enc Vitals Group     BP 127/83     Pulse Rate 72     Resp 17     Temp (!) 97.4 F (36.3 C)     Temp Source Oral     SpO2 98 %     Weight      Height      Head Circumference      Peak Flow      Pain Score      Pain Loc      Pain Edu?      Excl. in GC?    No data found.  Updated Vital Signs BP 127/83 (BP Location: Right Arm)   Pulse 72   Temp (!) 97.4 F (36.3 C) (Oral)   Resp 17   SpO2 98%   Visual Acuity Right Eye Distance:   Left Eye Distance:   Bilateral Distance:    Right Eye Near:   Left  Eye Near:    Bilateral Near:     Physical Exam Constitutional:      Appearance: Normal appearance. He is normal weight.  HENT:     Head: Normocephalic.     Right Ear: Ear canal and external ear normal. No decreased hearing noted. A middle ear effusion is present.     Left Ear: Hearing, ear canal and external ear normal. A middle ear effusion is present.     Nose: Rhinorrhea present.     Right Turbinates: Pale.     Left Turbinates: Pale.     Right Sinus: No maxillary sinus tenderness or frontal sinus tenderness.     Left Sinus: No maxillary sinus tenderness or frontal sinus tenderness.  Eyes:     Extraocular Movements: Extraocular movements intact.  Cardiovascular:     Rate and Rhythm: Normal rate and regular rhythm.     Pulses: Normal pulses.     Heart sounds: Normal heart sounds.  Pulmonary:     Effort: Pulmonary effort is normal.      Breath sounds: Normal breath sounds.  Musculoskeletal:        General: Normal range of motion.     Cervical back: Normal range of motion and neck supple. No tenderness.  Lymphadenopathy:     Cervical: No cervical adenopathy.  Skin:    General: Skin is warm and dry.  Neurological:     General: No focal deficit present.     Mental Status: He is alert and oriented to person, place, and time. Mental status is at baseline.  Psychiatric:        Mood and Affect: Mood normal.        Behavior: Behavior normal.        Thought Content: Thought content normal.        Judgment: Judgment normal.      UC Treatments / Results  Labs (all labs ordered are listed, but only abnormal results are displayed) Labs Reviewed - No data to display  EKG   Radiology No results found.  Procedures Procedures (including critical care time)  Medications Ordered in UC Medications - No data to display  Initial Impression / Assessment and Plan / UC Course  I have reviewed the triage vital signs and the nursing notes.  Pertinent labs & imaging results that were available during my care of the patient were reviewed by me and considered in my medical decision making (see chart for details).  Asthma due to allergies  1. Zyrtec 10 mg daily 2.flonase 1 puff each nare daily 3. Encouraged use on albuterol as needed Final Clinical Impressions(s) / UC Diagnoses   Final diagnoses:  Asthma due to seasonal allergies     Discharge Instructions     Take allergy medication daily before bedtime  Can use nasal spray 1 puff in each nostril once in the morning  Use nebulizer as needed for shortness of breath or chest tightness, an accelerated heart rate for a short period of time after use may occur    ED Prescriptions    Medication Sig Dispense Auth. Provider   cetirizine (ZYRTEC ALLERGY) 10 MG tablet Take 1 tablet (10 mg total) by mouth daily. 30 tablet Kiana Hollar R, NP   fluticasone (FLONASE) 50  MCG/ACT nasal spray Place 1 spray into both nostrils daily. 11.1 mL Valinda Hoar, NP     PDMP not reviewed this encounter.   Valinda Hoar, NP 01/03/21 1339

## 2021-01-03 NOTE — ED Triage Notes (Signed)
Pt presents with bilateral ear fullness and not being able to hear well; pt also complains of shortness of breath and chest discomfort.

## 2021-01-03 NOTE — Discharge Instructions (Signed)
Take allergy medication daily before bedtime  Can use nasal spray 1 puff in each nostril once in the morning  Use nebulizer as needed for shortness of breath or chest tightness, an accelerated heart rate for a short period of time after use may occur

## 2021-05-14 ENCOUNTER — Encounter (HOSPITAL_COMMUNITY): Payer: Self-pay | Admitting: Emergency Medicine

## 2021-05-14 ENCOUNTER — Telehealth: Payer: Self-pay | Admitting: Internal Medicine

## 2021-05-14 ENCOUNTER — Other Ambulatory Visit: Payer: Self-pay

## 2021-05-14 ENCOUNTER — Emergency Department (HOSPITAL_COMMUNITY)
Admission: EM | Admit: 2021-05-14 | Discharge: 2021-05-14 | Disposition: A | Discharge status: Home / Self Care | Payer: Self-pay | Attending: Emergency Medicine | Admitting: Emergency Medicine

## 2021-05-14 DIAGNOSIS — J45909 Unspecified asthma, uncomplicated: Secondary | ICD-10-CM | POA: Insufficient documentation

## 2021-05-14 DIAGNOSIS — K6289 Other specified diseases of anus and rectum: Secondary | ICD-10-CM | POA: Insufficient documentation

## 2021-05-14 DIAGNOSIS — R21 Rash and other nonspecific skin eruption: Secondary | ICD-10-CM | POA: Insufficient documentation

## 2021-05-14 LAB — URINALYSIS, ROUTINE W REFLEX MICROSCOPIC
Bilirubin Urine: NEGATIVE
Glucose, UA: NEGATIVE mg/dL
Hgb urine dipstick: NEGATIVE
Ketones, ur: 20 mg/dL — AB
Leukocytes,Ua: NEGATIVE
Nitrite: NEGATIVE
Protein, ur: NEGATIVE mg/dL
Specific Gravity, Urine: 1.013 (ref 1.005–1.030)
pH: 6 (ref 5.0–8.0)

## 2021-05-14 LAB — CBC WITH DIFFERENTIAL/PLATELET
Abs Immature Granulocytes: 0.03 10*3/uL (ref 0.00–0.07)
Basophils Absolute: 0 10*3/uL (ref 0.0–0.1)
Basophils Relative: 0 %
Eosinophils Absolute: 0.2 10*3/uL (ref 0.0–0.5)
Eosinophils Relative: 2 %
HCT: 45.3 % (ref 39.0–52.0)
Hemoglobin: 14.5 g/dL (ref 13.0–17.0)
Immature Granulocytes: 0 %
Lymphocytes Relative: 20 %
Lymphs Abs: 2.5 10*3/uL (ref 0.7–4.0)
MCH: 26 pg (ref 26.0–34.0)
MCHC: 32 g/dL (ref 30.0–36.0)
MCV: 81.2 fL (ref 80.0–100.0)
Monocytes Absolute: 0.8 10*3/uL (ref 0.1–1.0)
Monocytes Relative: 7 %
Neutro Abs: 9.2 10*3/uL — ABNORMAL HIGH (ref 1.7–7.7)
Neutrophils Relative %: 71 %
Platelets: 225 10*3/uL (ref 150–400)
RBC: 5.58 MIL/uL (ref 4.22–5.81)
RDW: 12.5 % (ref 11.5–15.5)
WBC: 12.7 10*3/uL — ABNORMAL HIGH (ref 4.0–10.5)
nRBC: 0 % (ref 0.0–0.2)

## 2021-05-14 LAB — BASIC METABOLIC PANEL
Anion gap: 11 (ref 5–15)
BUN: 6 mg/dL (ref 6–20)
CO2: 23 mmol/L (ref 22–32)
Calcium: 9.1 mg/dL (ref 8.9–10.3)
Chloride: 98 mmol/L (ref 98–111)
Creatinine, Ser: 0.91 mg/dL (ref 0.61–1.24)
GFR, Estimated: >60 mL/min (ref >60)
Glucose, Bld: 98 mg/dL (ref 70–99)
Potassium: 3.7 mmol/L (ref 3.5–5.1)
Sodium: 132 mmol/L — ABNORMAL LOW (ref 135–145)

## 2021-05-14 LAB — HIV ANTIBODY (ROUTINE TESTING W REFLEX): HIV Screen 4th Generation wRfx: NONREACTIVE

## 2021-05-14 MED ORDER — HYDROCORTISONE (PERIANAL) 2.5 % EX CREA: 1.0000 "application " | TOPICAL_CREAM | Freq: Two times a day (BID) | CUTANEOUS | 0 refills | Status: AC

## 2021-05-14 NOTE — ED Triage Notes (Signed)
Patient arrives with a rash and rectal pain. Patient states that Monday, he had a rash on his anterior left arm, which subsequently went away later in the day. Patient states that on Thursday, the rash came back and has been constant since. Patient states that he also has "some bumps and swelling" around his rectum with associated rectal pain. Patient thought that he was constipated, so he took a stool softener, with no relief of pain.

## 2021-05-14 NOTE — ED Provider Notes (Addendum)
Patient seen in conjunction with orienting PA.  In summation 30 year old here for evaluation of rash that began on Monday.  Sexually active with male partners, last sexual contact 1.5 weeks ago. Receptive anal intercourse. No known monkey pox exposures that he knows of.  Did have prodromal symptoms of myalgias, subjective fever last week.  Rash initially pruritic in nature.  He has diffuse pustules and a few umbilicated lesions diffusely about bilateral arms, legs, trunk and one on face.  No lesions to palms or soles.  No intraoral lesions.  No lesions to penis. He also has had some pain to surrounding his rectum. Admits constipation.   Rectal exam with multiple pustule/ umbilicated lesions. No discharge.  Does have small hemorrhoid.  No GU induration or fluctuance to suggest abscess.  No skin breakdown, erythema to suggest gangrene. Nontender prostate on exam to suggest prostatitis. No melena or BRBPR. No pain to scrotum. Exam with chaperone, tech and additional PA in room  No vesicles to suggest Syphilis, herpes  STD testing obtained with urine as well as GC rectal swabs.  Labs reviewed. WNL  CONSULT with Dr. Renold Don with ID.  Will follow-up with patient outpatient.  Patient wants to hold on Oral meds until Monkey pox results.  Discussed with patient strict home isolation, let partners know so they may be tested as well. Return for new or worsening symptoms       Donnisha Besecker A, PA-C 05/14/21 1509    Ernie Avena, MD 05/15/21 1007

## 2021-05-14 NOTE — ED Provider Notes (Signed)
[facility]-EMERGENCY DEPT Provider Note   CSN: [id number] Arrival date & time: 05/14/21  0620     History Chief Complaint  Patient presents with   Rash   Rectal Pain    Raymond Orozco is a 30 y.o. male with a PMH of asthma and left sided deafness.  Patient states that he had a fever and chills about one week ago. He then developed a rash over his arms, legs, and torso that he describes as feeling like sandpaper. On Monday, the rash turned into small pustules that itch. These spots are all over his torso, arms, and legs. He also is complaining of rectal pain and hemorrhoids. He states that he had intercourse with a male partner right before these symptoms began. He denies any changes in soaps or detergents.    Rash Associated symptoms: fever and myalgias   Associated symptoms: no shortness of breath and not wheezing       Past Medical History:  Diagnosis Date   Asthma    Deaf, left     There are no problems to display for this patient.   History reviewed. No pertinent surgical history.     Family History  Family history unknown: Yes    Social History   Tobacco Use   Smoking status: Never   Smokeless tobacco: Never  Vaping Use   Vaping Use: Never used  Substance Use Topics   Alcohol use: Yes    Comment: socially   Drug use: No    Home Medications Prior to Admission medications   Medication Sig Start Date End Date Taking? Authorizing Provider  hydrocortisone (ANUSOL-HC) 2.5 % rectal cream Place 1 application rectally 2 (two) times daily. 05/14/21  Yes Tiyana Galla, Finis Bud, PA-C  cetirizine (ZYRTEC ALLERGY) 10 MG tablet Take 1 tablet (10 mg total) by mouth daily. 01/03/21   White, Elita Boone, NP  fluticasone (FLONASE) 50 MCG/ACT nasal spray Place 1 spray into both nostrils daily. 01/03/21   White, Elita Boone, NP  hydrocortisone cream 0.5 % Apply 1 application topically 2 (two) times daily. Patient not taking: Reported on 03/09/2019 04/08/18    Dorrell, Cathleen Corti, MD  ondansetron (ZOFRAN ODT) 8 MG disintegrating tablet 8mg  ODT q4 hours prn nausea 03/09/19   03/11/19, MD    Allergies    Patient has no known allergies.  Review of Systems   Review of Systems  Constitutional:  Positive for fever.  HENT:  Negative for congestion.   Respiratory:  Negative for shortness of breath and wheezing.   Cardiovascular:  Negative for chest pain.  Gastrointestinal:  Positive for rectal pain.       Hemorrhoids  Genitourinary:  Negative for genital sores.  Musculoskeletal:  Positive for myalgias.  Skin:  Positive for rash.  Hematological: Negative.   Psychiatric/Behavioral: Negative.    All other systems reviewed and are negative.  Physical Exam Updated Vital Signs BP 107/70   Pulse 79   Temp 98.5 F (36.9 C) (Oral)   Resp 15   Ht 5\' 5"  (1.651 m)   Wt 82.1 kg   SpO2 99%   BMI 30.12 kg/m   Physical Exam Vitals and nursing note reviewed. Exam conducted with a chaperone present.  Constitutional:      General: He is not in acute distress.    Appearance: Normal appearance.  HENT:     Head: Normocephalic and atraumatic.     Mouth/Throat:     Mouth: Mucous membranes are dry.  Eyes:  Conjunctiva/sclera: Conjunctivae normal.  Pulmonary:     Effort: Pulmonary effort is normal. No respiratory distress.  Abdominal:     General: Abdomen is flat. There is no distension.     Palpations: Abdomen is soft.  Genitourinary:    Pubic Area: No rash.      Penis: Normal.      Rectum: Tenderness present. No external hemorrhoid.     Comments: Pustular lesions noted around rectum Skin:    General: Skin is warm and dry.     Findings: Rash present. Rash is papular and vesicular.     Comments: Pustular lesions in various healing stages over bilateral arms, legs. Spares palms and soles. One lesion on upper lip.   Neurological:     Mental Status: He is alert and oriented to person, place, and time.  Psychiatric:        Mood and Affect:  Mood normal.        Behavior: Behavior normal.    ED Results / Procedures / Treatments   Labs (all labs ordered are listed, but only abnormal results are displayed) Labs Reviewed  CBC WITH DIFFERENTIAL/PLATELET - Abnormal; Notable for the following components:      Result Value   WBC 12.7 (*)    Neutro Abs 9.2 (*)    All other components within normal limits  BASIC METABOLIC PANEL - Abnormal; Notable for the following components:   Sodium 132 (*)    All other components within normal limits  URINALYSIS, ROUTINE W REFLEX MICROSCOPIC - Abnormal; Notable for the following components:   Ketones, ur 20 (*)    All other components within normal limits  MONKEYPOX VIRUS DNA, QUALITATIVE REAL-TIME PCR  MONKEYPOX VIRUS DNA, QUALITATIVE REAL-TIME PCR  RPR  HIV ANTIBODY (ROUTINE TESTING W REFLEX)  GC/CHLAMYDIA PROBE AMP ([place]) NOT AT [facility]  GC/CHLAMYDIA PROBE AMP ([place]) NOT AT [facility]    EKG None  Radiology No results found.  Procedures Procedures   Medications Ordered in ED Medications - No data to display  ED Course  I have reviewed the triage vital signs and the nursing notes.  Pertinent labs & imaging results that were available during my care of the patient were reviewed by me and considered in my medical decision making (see chart for details).  Clinical Course as of 05/14/21 1328  Sat May 14, 2021  0715 Rash suspicious for suspected Monkey pox  [GL]  0850 Mild leukocytosis [GL]  0952 Labs otherwise unremarkable  [GL]  1028 Per ID, labs, swabs sent. Will FU in ID clinic. Stable for dc home [BH]    Clinical Course User Index [BH] Henderly, Britni A, PA-C [GL] Jackqulyn Mendel, Finis Bud, PA-C   MDM Rules/Calculators/A&P                           Patient with pox like rash. He has known risk factors for monkey pox. Patient was seen in conjunction with MD, Lawsing who agrees to treat this patient like suspected monkey pox. ID consulted. Precautions initiated. BMP,  CBC, HIV, RPR, UA, Monkeypox PCR, and GC/Chlamydia testing ordered. Labs obtained unremarkable. Contacted ID and they will see him in clinic in three days. Patient instructed to remain in isolation for 21 days. Culture results are still pending. Patient stable for discharge. Anusol is prescribed for rectal pain and hemorrhoids.   Final Clinical Impression(s) / ED Diagnoses Final diagnoses:  Rash  Rectal pain    Rx /  DC Orders ED Discharge Orders          Ordered    hydrocortisone (ANUSOL-HC) 2.5 % rectal cream  2 times daily        05/14/21 1101             Aamirah Salmi, Kathee Delton 05/14/21 1329    Ernie Avena, MD 05/15/21 1008

## 2021-05-14 NOTE — Discharge Instructions (Addendum)
You have been seen in the ED for suspected Monkey Pox. While your results are pending,  please self isolate for 21 days. We will write you out of work during this time. Expect a phone call from Infectious Disease in the upcoming days. Call to schedule an appointment with them if you do not hear from them within one week.   You are prescribed a cream for your hemorrhoids and rectal discomfort.

## 2021-05-15 LAB — RPR: RPR Ser Ql: NONREACTIVE

## 2021-05-16 ENCOUNTER — Telehealth: Payer: Self-pay

## 2021-05-16 LAB — GC/CHLAMYDIA PROBE AMP (~~LOC~~) NOT AT ARMC
Chlamydia: POSITIVE — AB
Comment: NEGATIVE
Comment: NORMAL
Neisseria Gonorrhea: NEGATIVE

## 2021-05-16 LAB — MONKEYPOX VIRUS DNA, QUALITATIVE REAL-TIME PCR: Orthopoxvirus DNA, QL PCR: DETECTED — AB

## 2021-05-16 NOTE — Telephone Encounter (Signed)
Called patient to get scheduled, someone else answered and stated he wasn't there said I would call back later.Raymond Orozco   "Hi team, please schedule a new patient slot for this patient in the next 1-3 weeks with dr Drue Second or other provider to check on ED visit 8/20 "

## 2021-05-17 NOTE — Telephone Encounter (Signed)
Patient's appointment 8/25  Needs tx at least chlamydia Positive monkey pox, mild disease Hiv negative  Prep discussion if indicated

## 2021-05-17 NOTE — Telephone Encounter (Signed)
Patient was notified of test results. He is uninsured and will need to go in a PrEP slot for coverage of treatment. Notified of appointment time.

## 2021-05-19 ENCOUNTER — Telehealth: Payer: Self-pay

## 2021-05-19 ENCOUNTER — Other Ambulatory Visit (HOSPITAL_COMMUNITY): Payer: Self-pay

## 2021-05-19 ENCOUNTER — Encounter: Payer: Self-pay | Admitting: Internal Medicine

## 2021-05-19 NOTE — Telephone Encounter (Signed)
[facility] Patient Advocate Encounter ? ?Insurance verification completed.   ? ?The patient is uninsured and will need patient assistance for medication. ? ?We can complete the application and will need to meet with the patient for signatures and income documentation. ? ?Eamonn Sermeno, CPhT ?Specialty Pharmacy Patient Advocate ?[facility] ?Phone: [phone] ?Fax:  [phone]  ?

## 2021-05-20 ENCOUNTER — Other Ambulatory Visit: Payer: Self-pay

## 2021-05-20 ENCOUNTER — Encounter: Payer: Self-pay | Admitting: Infectious Disease

## 2021-05-20 ENCOUNTER — Telehealth: Payer: Self-pay | Admitting: Pharmacist

## 2021-05-20 ENCOUNTER — Other Ambulatory Visit (HOSPITAL_COMMUNITY): Payer: Self-pay

## 2021-05-20 ENCOUNTER — Ambulatory Visit (INDEPENDENT_AMBULATORY_CARE_PROVIDER_SITE_OTHER): Payer: Self-pay | Admitting: Infectious Disease

## 2021-05-20 VITALS — BP 122/80 | HR 66 | Temp 98.6°F | Wt 176.0 lb

## 2021-05-20 DIAGNOSIS — A749 Chlamydial infection, unspecified: Secondary | ICD-10-CM

## 2021-05-20 DIAGNOSIS — B04 Monkeypox: Secondary | ICD-10-CM | POA: Insufficient documentation

## 2021-05-20 DIAGNOSIS — Z7251 High risk heterosexual behavior: Secondary | ICD-10-CM

## 2021-05-20 DIAGNOSIS — K649 Unspecified hemorrhoids: Secondary | ICD-10-CM | POA: Insufficient documentation

## 2021-05-20 DIAGNOSIS — Z79899 Other long term (current) drug therapy: Secondary | ICD-10-CM | POA: Insufficient documentation

## 2021-05-20 HISTORY — DX: Chlamydial infection, unspecified: A74.9

## 2021-05-20 HISTORY — DX: Other long term (current) drug therapy: Z79.899

## 2021-05-20 HISTORY — DX: Unspecified hemorrhoids: K64.9

## 2021-05-20 HISTORY — DX: Monkeypox: B04

## 2021-05-20 MED ORDER — EMTRICITABINE-TENOFOVIR AF 200-25 MG PO TABS
1.0000 | ORAL_TABLET | Freq: Every day | ORAL | 0 refills | Status: DC
  Filled 2021-05-20 (×2): qty 30, 30d supply, fill #0

## 2021-05-20 MED ORDER — DOXYCYCLINE HYCLATE 100 MG PO TABS
100.0000 mg | ORAL_TABLET | Freq: Two times a day (BID) | ORAL | 0 refills | Status: AC
  Filled 2021-05-20: qty 42, 21d supply, fill #0

## 2021-05-20 NOTE — Progress Notes (Signed)
Subjective:   Reason for Infectious Disease Consult: Monkeypox infection, Chlamydia and consideration of PrEP to prevent HIV  Requesting Physician: Ernie Avena, MD    Patient ID: Raymond Orozco, male    DOB: Feb 20, 1991, 30 y.o.   MRN: [id number]  HPI  Raymond Orozco is a very nice 30 year old Black man who came to the ER on May 14, 2021 with concern for monkey box infection.  He had had receptive anal intercourse about 1-1/2 weeks prior to being seen in the ER.  The Monday prior to being seen he had developed a rash.  Prior to this he had a prodromal syndrome of malaise and subjective fever.  Rash was diffuse and initially pruritic in nature he had pustules on exam in the ER by on bilaterally on arms legs trunk as well as 1 on his face and nose.  In the ER he is also found to have a couple pustules and umbilicated lesions in the rectum.  He also apparently has a small hemorrhoid.  He was tested for monkey box in the ER and testing came back positive by PCR.  His rectal chlamydia test also came back positive.  His HIV fourth-generation assay was negative.  He was referred to our clinic for consideration of treatment for monkey box.  He was offered treatment ER but wanted to wait till the testing came back.  Today he is telling me that he is feeling much better and he is noticing that most of his lesions are getting better and disappearing though there still are some visible.  We reviewed reasons for initiating treatment with TPOXX but he did not feel compelling to be treated this point in time.  He was very eager to get the chlamydia treated and he was also interested in HIV preexposure prophylaxis and we initiated both treatment for his chlamydia with doxycycline and also IV preexposure prophylaxis with DESCOVY.  I would like to get him onto Apretude which has been superior in studies to oral PrEP.     Past Medical History:  Diagnosis Date   Asthma    Chlamydia 05/20/2021   Deaf,  left    Monkeypox 05/20/2021   On pre-exposure prophylaxis for HIV 05/20/2021    No past surgical history on file.  Family History  Family history unknown: Yes      Social History   Socioeconomic History   Marital status: Single    Spouse name: Not on file   Number of children: Not on file   Years of education: Not on file   Highest education level: Not on file  Occupational History   Not on file  Tobacco Use   Smoking status: Never   Smokeless tobacco: Never  Vaping Use   Vaping Use: Never used  Substance and Sexual Activity   Alcohol use: Yes    Comment: socially   Drug use: No   Sexual activity: Not on file  Other Topics Concern   Not on file  Social History Narrative   Not on file   Social Determinants of Health   Financial Resource Strain: Not on file  Food Insecurity: Not on file  Transportation Needs: Not on file  Physical Activity: Not on file  Stress: Not on file  Social Connections: Not on file    No Known Allergies   Current Outpatient Medications:    cetirizine (ZYRTEC ALLERGY) 10 MG tablet, Take 1 tablet (10 mg total) by mouth daily., Disp: 30 tablet, Rfl: 1   fluticasone (  FLONASE) 50 MCG/ACT nasal spray, Place 1 spray into both nostrils daily., Disp: 11.1 mL, Rfl: 1   hydrocortisone (ANUSOL-HC) 2.5 % rectal cream, Place 1 application rectally 2 (two) times daily., Disp: 30 g, Rfl: 0   hydrocortisone cream 0.5 %, Apply 1 application topically 2 (two) times daily. (Patient not taking: Reported on 03/09/2019), Disp: 30 g, Rfl: 0   ondansetron (ZOFRAN ODT) 8 MG disintegrating tablet, 8mg  ODT q4 hours prn nausea, Disp: 4 tablet, Rfl: 0   Review of Systems  Constitutional:  Positive for fatigue and fever. Negative for activity change, appetite change, chills, diaphoresis and unexpected weight change.  HENT:  Negative for congestion, rhinorrhea, sinus pressure, sneezing, sore throat and trouble swallowing.   Eyes:  Negative for photophobia and visual  disturbance.  Respiratory:  Negative for cough, chest tightness, shortness of breath, wheezing and stridor.   Cardiovascular:  Negative for chest pain, palpitations and leg swelling.  Gastrointestinal:  Positive for rectal pain. Negative for abdominal distention, abdominal pain, anal bleeding, blood in stool, constipation, diarrhea, nausea and vomiting.  Genitourinary:  Negative for difficulty urinating, dysuria, flank pain and hematuria.  Musculoskeletal:  Negative for arthralgias, back pain, gait problem, joint swelling and myalgias.  Skin:  Positive for rash. Negative for color change, pallor and wound.  Neurological:  Negative for dizziness, tremors, weakness and light-headedness.  Hematological:  Negative for adenopathy. Does not bruise/bleed easily.  Psychiatric/Behavioral:  Negative for agitation, behavioral problems, confusion, decreased concentration, dysphoric mood and sleep disturbance. The patient is nervous/anxious.       Objective:   Physical Exam Constitutional:      Appearance: He is well-developed.  HENT:     Head: Normocephalic and atraumatic.     Comments: Hard of hearing in the left ear in particular Eyes:     Conjunctiva/sclera: Conjunctivae normal.  Cardiovascular:     Rate and Rhythm: Normal rate and regular rhythm.  Pulmonary:     Effort: Pulmonary effort is normal. No respiratory distress.     Breath sounds: No wheezing.  Abdominal:     General: There is no distension.     Palpations: Abdomen is soft.  Musculoskeletal:        General: No tenderness. Normal range of motion.     Cervical back: Normal range of motion and neck supple.  Skin:    General: Skin is warm and dry.     Coloration: Skin is not pale.     Findings: Rash present. No erythema.  Neurological:     General: No focal deficit present.     Mental Status: He is alert and oriented to person, place, and time.  Psychiatric:        Mood and Affect: Mood normal.        Behavior: Behavior normal.         Thought Content: Thought content normal.        Judgment: Judgment normal.     Diffuse rash still visible with a lesion on his forehead his nose on his arms and legs see picture below for examples of some of the monkeypox lesions  05/20/2021:          Assessment & Plan:   Human monkeypox:  I was preparing to initiate TPOXX but he did not feel compelling desire to start therapy now that he is well into 2nd week of having this and is improving clinically  We will continue to observe strict precautions in terms of isolation to prevent spread to  others.  Chlamydia: I am going give him doxycycline 100 mg twice daily for 21 days given that he has had a rectal site of involvement and there is greater chance of LGV  HIV preexposure prophylaxis:  fourth-generation test was negative  Initiated preexposure prophylaxis was DESCOVY and our ID pharmacy team were able to enroll him in the advancing access program from [employer].  I intend to enroll him into [employer] and procure Apretude which has been a superior drug clinical trials to oral prep in the form of Truvada.  I have scheduled him see me back in 1 month's time.  I spent 82  minutes with the patient including greater than 50% of the time in face to face counseling of the patient guarding the nature of monkeypox infection, chlamydial infection HIV infection and HIV prevention, reviewing the different options for HIV prevention, potential side effects to medications we use to prevent HIV (though these medications are generally well-tolerated) reviewing his labs including his is the monkey box PCR on May 14, 2021 his negative RPR from the same date is negative fourth-generation HIV test is positive chlamydia test from rectum negative gonorrhea test. review of medical records , preparation of forms before and during the visit and in coordination of his care with ID pharmacy.

## 2021-05-20 NOTE — Telephone Encounter (Signed)
Patient is approved through [employer] Advancing Access for [employer]:   Margarite Gouge, PharmD, CPP Clinical Pharmacist Practitioner Infectious Diseases Clinical Pharmacist [facility]

## 2021-05-23 ENCOUNTER — Other Ambulatory Visit (HOSPITAL_COMMUNITY): Payer: Self-pay

## 2021-06-13 ENCOUNTER — Telehealth: Payer: Self-pay

## 2021-06-13 NOTE — Telephone Encounter (Signed)
[facility] Patient Advocate Encounter   I have been unsuccsessful in reaching patient to be able to get [employer] Patient Assistance for Apretude.  I will need patient Income Verification.      Clearance Coots, CPhT Specialty Pharmacy Patient [facility] Phone: [phone] Fax:  [phone]

## 2021-06-20 ENCOUNTER — Other Ambulatory Visit (HOSPITAL_COMMUNITY): Payer: Self-pay

## 2021-06-20 ENCOUNTER — Ambulatory Visit: Payer: Self-pay | Admitting: Infectious Disease

## 2021-08-01 ENCOUNTER — Other Ambulatory Visit (HOSPITAL_COMMUNITY): Payer: Self-pay

## 2021-08-28 ENCOUNTER — Other Ambulatory Visit (HOSPITAL_COMMUNITY): Payer: Self-pay

## 2022-02-06 ENCOUNTER — Ambulatory Visit (HOSPITAL_COMMUNITY)
Admission: EM | Admit: 2022-02-06 | Discharge: 2022-02-06 | Disposition: A | Discharge status: Home / Self Care | Payer: Self-pay | Attending: Internal Medicine | Admitting: Internal Medicine

## 2022-02-06 ENCOUNTER — Encounter (HOSPITAL_COMMUNITY): Payer: Self-pay

## 2022-02-06 DIAGNOSIS — R062 Wheezing: Secondary | ICD-10-CM

## 2022-02-06 DIAGNOSIS — J4521 Mild intermittent asthma with (acute) exacerbation: Secondary | ICD-10-CM

## 2022-02-06 MED ORDER — PREDNISONE 20 MG PO TABS: 40.0000 mg | ORAL_TABLET | Freq: Every day | ORAL | 0 refills | Status: AC

## 2022-02-06 MED ORDER — ALBUTEROL SULFATE (2.5 MG/3ML) 0.083% IN NEBU
2.5000 mg | INHALATION_SOLUTION | Freq: Once | RESPIRATORY_TRACT | Status: AC
  Administered 2022-02-06: 2.5 mg via RESPIRATORY_TRACT

## 2022-02-06 MED ORDER — ALBUTEROL SULFATE (2.5 MG/3ML) 0.083% IN NEBU
INHALATION_SOLUTION | RESPIRATORY_TRACT | Status: AC
  Filled 2022-02-06: qty 3

## 2022-02-06 MED ORDER — ALBUTEROL SULFATE (2.5 MG/3ML) 0.083% IN NEBU: 2.5000 mg | INHALATION_SOLUTION | Freq: Four times a day (QID) | RESPIRATORY_TRACT | 12 refills | Status: AC | PRN

## 2022-02-06 MED ORDER — ALBUTEROL SULFATE HFA 108 (90 BASE) MCG/ACT IN AERS: 1.0000 | INHALATION_SPRAY | Freq: Four times a day (QID) | RESPIRATORY_TRACT | 0 refills | Status: AC | PRN

## 2022-02-06 NOTE — Discharge Instructions (Signed)
You have been prescribed prednisone steroid, albuterol nebulizer solution, albuterol inhaler.  Please be advised that inhaler and nebulizer solution are same medication so please use them at least 4 to 6 hours apart if using the same day.  Follow-up if symptoms persist or worsen.  Also recommended daily antihistamine such as Zyrtec to prevent asthma exacerbation. ?

## 2022-02-06 NOTE — ED Triage Notes (Addendum)
Pt presents with possible asthma flare up. Pt reports wheezing and chest tightness that started Friday. History of same this time of year. Pt does not have medication at home.  ?

## 2022-02-06 NOTE — ED Provider Notes (Signed)
?[facility] ? ? ? ?CSN: [id number] ?Arrival date & time: 02/06/22  1457 ? ? ?  ? ?History   ?Chief Complaint ?Chief Complaint  ?Patient presents with  ? Wheezing  ? ? ?HPI ?Raymond Orozco is a 31 y.o. male.  ? ?Patient presents with cough and wheezing that has been present for approximately 2 to 3 days.  Patient reports he typically has albuterol inhaler and albuterol nebulizer solution but has run out and has not been able to use it for this asthma exacerbation.  He also reports that asthma typically flares up with season change.  He does not take any daily antihistamines.  Denies any associated upper respiratory symptoms or fever.  Denies any known sick contacts. ? ? ?Wheezing ? ?Past Medical History:  ?Diagnosis Date  ? Asthma   ? Chlamydia 05/20/2021  ? Deaf, left   ? Hemorrhoid 05/20/2021  ? Monkeypox 05/20/2021  ? On pre-exposure prophylaxis for HIV 05/20/2021  ? ? ?Patient Active Problem List  ? Diagnosis Date Noted  ? Monkeypox 05/20/2021  ? On pre-exposure prophylaxis for HIV 05/20/2021  ? Chlamydia 05/20/2021  ? Hemorrhoid 05/20/2021  ? ? ?History reviewed. No pertinent surgical history. ? ? ? ? ?Home Medications   ? ?Prior to Admission medications   ?Medication Sig Start Date End Date Taking? Authorizing Provider  ?albuterol (PROVENTIL) (2.5 MG/3ML) 0.083% nebulizer solution Take 3 mLs (2.5 mg total) by nebulization every 6 (six) hours as needed for wheezing or shortness of breath. 02/06/22  Yes Emmalynn Pinkham, Acie FredricksonHaley E, FNP  ?albuterol (VENTOLIN HFA) 108 (90 Base) MCG/ACT inhaler Inhale 1-2 puffs into the lungs every 6 (six) hours as needed for wheezing or shortness of breath. 02/06/22  Yes Gustavus BryantMound, Kiante Petrovich E, FNP  ?predniSONE (DELTASONE) 20 MG tablet Take 2 tablets (40 mg total) by mouth daily for 5 days. 02/06/22 02/11/22 Yes Gustavus BryantMound, Yoltzin Ransom E, FNP  ?cetirizine (ZYRTEC ALLERGY) 10 MG tablet Take 1 tablet (10 mg total) by mouth daily. 01/03/21   Valinda HoarWhite, Adrienne R, NP  ?emtricitabine-tenofovir AF (DESCOVY) 200-25 MG  tablet Take 1 tablet by mouth daily. 05/20/21   Randall HissVan Dam, Cornelius N, MD  ?fluticasone [facility](FLONASE) 50 MCG/ACT nasal spray Place 1 spray into both nostrils daily. 01/03/21   Valinda HoarWhite, Adrienne R, NP  ?hydrocortisone (ANUSOL-HC) 2.5 % rectal cream Place 1 application rectally 2 (two) times daily. 05/14/21   Loeffler, Finis BudGrace C, PA-C  ?hydrocortisone cream 0.5 % Apply 1 application topically 2 (two) times daily. ?Patient not taking: Reported on 03/09/2019 04/08/18   Dorrell, Cathleen Cortieborah N, MD  ?ondansetron (ZOFRAN ODT) 8 MG disintegrating tablet 8mg  ODT q4 hours prn nausea 03/09/19   Geoffery Lyonselo, Douglas, MD  ? ? ?Family History ?Family History  ?Family history unknown: Yes  ? ? ?Social History ?Social History  ? ?Tobacco Use  ? Smoking status: Never  ? Smokeless tobacco: Never  ?Vaping Use  ? Vaping Use: Never used  ?Substance Use Topics  ? Alcohol use: Yes  ?  Comment: socially  ? Drug use: No  ? ? ? ?Allergies   ?Patient has no known allergies. ? ? ?Review of Systems ?Review of Systems ?Per HPI ? ?Physical Exam ?Triage Vital Signs ?ED Triage Vitals  ?Enc Vitals Group  ?   BP 02/06/22 1623 (!) 124/94  ?   Pulse Rate 02/06/22 1623 61  ?   Resp 02/06/22 1623 19  ?   Temp 02/06/22 1623 98.2 ?F (36.8 ?C)  ?   Temp src --   ?  SpO2 02/06/22 1623 98 %  ?   Weight --   ?   Height --   ?   Head Circumference --   ?   Peak Flow --   ?   Pain Score 02/06/22 1622 7  ?   Pain Loc --   ?   Pain Edu? --   ?   Excl. in GC? --   ? ?No data found. ? ?Updated Vital Signs ?BP (!) 124/94   Pulse 61   Temp 98.2 ?F (36.8 ?C)   Resp 19   SpO2 98%  ? ?Visual Acuity ?Right Eye Distance:   ?Left Eye Distance:   ?Bilateral Distance:   ? ?Right Eye Near:   ?Left Eye Near:    ?Bilateral Near:    ? ?Physical Exam ?Constitutional:   ?   General: He is not in acute distress. ?   Appearance: Normal appearance. He is not toxic-appearing or diaphoretic.  ?HENT:  ?   Head: Normocephalic and atraumatic.  ?   Right Ear: Tympanic membrane and ear canal normal.  ?   Left  Ear: Tympanic membrane and ear canal normal.  ?   Nose: Nose normal.  ?   Mouth/Throat:  ?   Mouth: Mucous membranes are moist.  ?   Pharynx: No posterior oropharyngeal erythema.  ?Eyes:  ?   Extraocular Movements: Extraocular movements intact.  ?   Conjunctiva/sclera: Conjunctivae normal.  ?   Pupils: Pupils are equal, round, and reactive to light.  ?Cardiovascular:  ?   Rate and Rhythm: Normal rate and regular rhythm.  ?   Pulses: Normal pulses.  ?   Heart sounds: Normal heart sounds.  ?Pulmonary:  ?   Effort: Pulmonary effort is normal. No respiratory distress.  ?   Breath sounds: No stridor. Wheezing present. No rhonchi or rales.  ?   Comments: Very mild wheezing on exam.  ?Abdominal:  ?   General: Abdomen is flat. Bowel sounds are normal.  ?   Palpations: Abdomen is soft.  ?Musculoskeletal:     ?   General: Normal range of motion.  ?   Cervical back: Normal range of motion.  ?Skin: ?   General: Skin is warm and dry.  ?Neurological:  ?   General: No focal deficit present.  ?   Mental Status: He is alert and oriented to person, place, and time. Mental status is at baseline.  ?Psychiatric:     ?   Mood and Affect: Mood normal.     ?   Behavior: Behavior normal.  ? ? ? ?UC Treatments / Results  ?Labs ?(all labs ordered are listed, but only abnormal results are displayed) ?Labs Reviewed - No data to display ? ?EKG ? ? ?Radiology ?No results found. ? ?Procedures ?Procedures (including critical care time) ? ?Medications Ordered in UC ?Medications  ?albuterol (PROVENTIL) (2.5 MG/3ML) 0.083% nebulizer solution 2.5 mg (2.5 mg Nebulization Given 02/06/22 1651)  ? ? ?Initial Impression / Assessment and Plan / UC Course  ?I have reviewed the triage vital signs and the nursing notes. ? ?Pertinent labs & imaging results that were available during my care of the patient were reviewed by me and considered in my medical decision making (see chart for details). ? ?  ? ?Symptoms are consistent with asthma exacerbation that is mild.   Albuterol nebulizer treatment was administered in urgent care today with improvement in symptoms per patient.  Lung sounds were also improved.  Patient sent home with  albuterol inhaler refill and albuterol nebulizer refill.  Prednisone steroid course was also prescribed for asthma exacerbation.  Recommended to patient to take daily antihistamine to prevent asthma flareups.  Discussed strict return and ER precautions.  Patient verbalized understanding and was agreeable with plan. ?Final Clinical Impressions(s) / UC Diagnoses  ? ?Final diagnoses:  ?Mild intermittent asthma with exacerbation  ?Wheezing  ? ? ? ?Discharge Instructions   ? ?  ?You have been prescribed prednisone steroid, albuterol nebulizer solution, albuterol inhaler.  Please be advised that inhaler and nebulizer solution are same medication so please use them at least 4 to 6 hours apart if using the same day.  Follow-up if symptoms persist or worsen.  Also recommended daily antihistamine such as Zyrtec to prevent asthma exacerbation. ? ? ? ?ED Prescriptions   ? ? Medication Sig Dispense Auth. Provider  ? albuterol (PROVENTIL) (2.5 MG/3ML) 0.083% nebulizer solution Take 3 mLs (2.5 mg total) by nebulization every 6 (six) hours as needed for wheezing or shortness of breath. 75 mL Ervin Knack E, [place]  ? albuterol (VENTOLIN HFA) 108 (90 Base) MCG/ACT inhaler Inhale 1-2 puffs into the lungs every 6 (six) hours as needed for wheezing or shortness of breath. 1 each Gustavus Bryant, FNP  ? predniSONE (DELTASONE) 20 MG tablet Take 2 tablets (40 mg total) by mouth daily for 5 days. 10 tablet Ervin Knack E, [place]  ? ?  ? ?PDMP not reviewed this encounter. ?  ?Gustavus Bryant, [place] ?02/06/22 1715 ? ?

## 2022-05-25 ENCOUNTER — Emergency Department (HOSPITAL_BASED_OUTPATIENT_CLINIC_OR_DEPARTMENT_OTHER)
Admission: EM | Admit: 2022-05-25 | Discharge: 2022-05-25 | Disposition: A | Discharge status: Home / Self Care | Payer: Worker's Compensation | Attending: Emergency Medicine | Admitting: Emergency Medicine

## 2022-05-25 ENCOUNTER — Encounter (HOSPITAL_BASED_OUTPATIENT_CLINIC_OR_DEPARTMENT_OTHER): Payer: Self-pay | Admitting: Emergency Medicine

## 2022-05-25 ENCOUNTER — Other Ambulatory Visit: Payer: Self-pay

## 2022-05-25 ENCOUNTER — Emergency Department (HOSPITAL_BASED_OUTPATIENT_CLINIC_OR_DEPARTMENT_OTHER): Payer: Self-pay

## 2022-05-25 DIAGNOSIS — W228XXA Striking against or struck by other objects, initial encounter: Secondary | ICD-10-CM | POA: Insufficient documentation

## 2022-05-25 DIAGNOSIS — Z23 Encounter for immunization: Secondary | ICD-10-CM | POA: Diagnosis not present

## 2022-05-25 DIAGNOSIS — S61215A Laceration without foreign body of left ring finger without damage to nail, initial encounter: Secondary | ICD-10-CM | POA: Diagnosis not present

## 2022-05-25 DIAGNOSIS — S61213A Laceration without foreign body of left middle finger without damage to nail, initial encounter: Secondary | ICD-10-CM | POA: Insufficient documentation

## 2022-05-25 DIAGNOSIS — S6992XA Unspecified injury of left wrist, hand and finger(s), initial encounter: Secondary | ICD-10-CM | POA: Diagnosis present

## 2022-05-25 MED ORDER — TETANUS-DIPHTH-ACELL PERTUSSIS 5-2.5-18.5 LF-MCG/0.5 IM SUSY
0.5000 mL | PREFILLED_SYRINGE | Freq: Once | INTRAMUSCULAR | Status: AC
  Administered 2022-05-25: 0.5 mL via INTRAMUSCULAR
  Filled 2022-05-25: qty 0.5

## 2022-05-25 MED ORDER — LIDOCAINE HCL (PF) 1 % IJ SOLN
5.0000 mL | Freq: Once | INTRAMUSCULAR | Status: AC
  Administered 2022-05-25: 5 mL
  Filled 2022-05-25: qty 5

## 2022-05-25 NOTE — Discharge Instructions (Signed)
Follow-up with your Worker's Comp. provider for recheck in 2 days.  Suture removal in 7 to 10 days.

## 2022-05-25 NOTE — ED Triage Notes (Signed)
Laceration to left middle and 4th finger with box cutter.  Bleeding controlled.

## 2022-05-25 NOTE — ED Provider Notes (Signed)
[facility] EMERGENCY DEPARTMENT Provider Note   CSN: [id number] Arrival date & time: 05/25/22  0935     History  Chief Complaint  Patient presents with   Laceration    Raymond Orozco is a 31 y.o. male.  31 year old right-hand-dominant male presents with complaint of laceration to the left third and fourth fingers which occurred today at work as an accident with a box cutter.  Bleeding is controlled with pressure, patient is not anticoagulated.  Denies any weakness or numbness in the fingers.  No other injuries, complaints or concerns.       Home Medications Prior to Admission medications   Medication Sig Start Date End Date Taking? Authorizing Provider  albuterol (PROVENTIL) (2.5 MG/3ML) 0.083% nebulizer solution Take 3 mLs (2.5 mg total) by nebulization every 6 (six) hours as needed for wheezing or shortness of breath. 02/06/22   Gustavus Bryant, FNP  albuterol (VENTOLIN HFA) 108 (90 Base) MCG/ACT inhaler Inhale 1-2 puffs into the lungs every 6 (six) hours as needed for wheezing or shortness of breath. 02/06/22   Gustavus Bryant, FNP  cetirizine (ZYRTEC ALLERGY) 10 MG tablet Take 1 tablet (10 mg total) by mouth daily. 01/03/21   Valinda Hoar, NP  emtricitabine-tenofovir AF (DESCOVY) 200-25 MG tablet Take 1 tablet by mouth daily. 05/20/21   Randall Hiss, MD  fluticasone [facility]) 50 MCG/ACT nasal spray Place 1 spray into both nostrils daily. 01/03/21   White, Elita Boone, NP  hydrocortisone (ANUSOL-HC) 2.5 % rectal cream Place 1 application rectally 2 (two) times daily. 05/14/21   Loeffler, Finis Bud, PA-C  hydrocortisone cream 0.5 % Apply 1 application topically 2 (two) times daily. Patient not taking: Reported on 03/09/2019 04/08/18   Dorrell, Cathleen Corti, MD  ondansetron (ZOFRAN ODT) 8 MG disintegrating tablet 8mg  ODT q4 hours prn nausea 03/09/19   03/11/19, MD      Allergies    Patient has no known allergies.    Review of Systems   Review of  Systems Negative except as per HPI Physical Exam Updated Vital Signs BP 126/83 (BP Location: Right Arm)   Pulse 60   Temp 98.5 F (36.9 C) (Oral)   Resp 16   Ht 5\' 6"  (1.676 m)   Wt 83.5 kg   SpO2 100%   BMI 29.70 kg/m  Physical Exam Vitals and nursing note reviewed.  Constitutional:      General: He is not in acute distress.    Appearance: He is well-developed. He is not diaphoretic.  HENT:     Head: Normocephalic and atraumatic.  Cardiovascular:     Pulses: Normal pulses.  Pulmonary:     Effort: Pulmonary effort is normal.  Musculoskeletal:        General: Signs of injury present. No swelling or deformity.     Comments: 2small simple lacerations to the left 3rd and 4th fingers near the DIP on the extensor surface. Sensation intact, FROM with good strength against resistance in extension.   Skin:    General: Skin is warm and dry.     Findings: No erythema or rash.  Neurological:     Mental Status: He is alert and oriented to person, place, and time.     Sensory: No sensory deficit.     Motor: No weakness.  Psychiatric:        Behavior: Behavior normal.     ED Results / Procedures / Treatments   Labs (all labs ordered are listed, but  only abnormal results are displayed) Labs Reviewed - No data to display  EKG None  Radiology DG Hand Complete Left  Result Date: 05/25/2022 CLINICAL DATA:  Laceration left third and fourth digit EXAM: LEFT HAND - COMPLETE 3+ VIEW COMPARISON:  None Available. FINDINGS: Frontal, oblique, lateral views of the left hand are obtained. No acute fracture, subluxation, or dislocation. Joint spaces are well preserved. Soft tissues are unremarkable. No radiopaque foreign body. IMPRESSION: 1. No acute fracture or radiopaque foreign body. Electronically Signed   By: Sharlet Salina M.D.   On: 05/25/2022 11:42    Procedures .Marland KitchenLaceration Repair  Date/Time: 05/25/2022 1:46 PM  Performed by: Jeannie Fend, PA-C Authorized by: Jeannie Fend,  PA-C   Consent:    Consent obtained:  Verbal   Consent given by:  Patient   Risks discussed:  Infection, need for additional repair, pain, poor cosmetic result, poor wound healing, tendon damage and nerve damage   Alternatives discussed:  No treatment and delayed treatment Universal protocol:    Procedure explained and questions answered to patient or proxy's satisfaction: yes     Relevant documents present and verified: yes     Test results available: yes     Imaging studies available: yes     Required blood products, implants, devices, and special equipment available: yes     Site/side marked: yes     Immediately prior to procedure, a time out was called: yes     Patient identity confirmed:  Verbally with patient Anesthesia:    Anesthesia method:  Local infiltration   Local anesthetic:  Lidocaine 1% w/o epi Laceration details:    Location:  Finger   Finger location:  L long finger   Length (cm):  0.7   Depth (mm):  2 Pre-procedure details:    Preparation:  Patient was prepped and draped in usual sterile fashion and imaging obtained to evaluate for foreign bodies Exploration:    Imaging obtained: x-ray     Imaging outcome: foreign body not noted     Wound exploration: wound explored through full range of motion and entire depth of wound visualized     Wound extent: no foreign bodies/material noted, no muscle damage noted, no tendon damage noted and no underlying fracture noted     Contaminated: no   Treatment:    Area cleansed with:  Saline   Amount of cleaning:  Standard   Irrigation solution:  Sterile saline Skin repair:    Repair method:  Sutures   Suture size:  4-0   Suture material:  Prolene   Suture technique:  Simple interrupted   Number of sutures:  1 Approximation:    Approximation:  Close Repair type:    Repair type:  Simple Post-procedure details:    Dressing:  Bulky dressing   Procedure completion:  Tolerated well, no immediate complications .Marland KitchenLaceration  Repair  Date/Time: 05/25/2022 1:48 PM  Performed by: Jeannie Fend, PA-C Authorized by: Jeannie Fend, PA-C   Consent:    Consent obtained:  Verbal   Consent given by:  Patient   Risks discussed:  Infection, need for additional repair, pain, poor cosmetic result and poor wound healing   Alternatives discussed:  No treatment and delayed treatment Universal protocol:    Procedure explained and questions answered to patient or proxy's satisfaction: yes     Relevant documents present and verified: yes     Test results available: yes     Imaging studies available: yes  Required blood products, implants, devices, and special equipment available: yes     Site/side marked: yes     Immediately prior to procedure, a time out was called: yes     Patient identity confirmed:  Verbally with patient Anesthesia:    Anesthesia method:  Local infiltration   Local anesthetic:  Lidocaine 1% w/o epi Laceration details:    Location:  Finger   Finger location:  L ring finger   Length (cm):  1   Depth (mm):  2 Pre-procedure details:    Preparation:  Patient was prepped and draped in usual sterile fashion and imaging obtained to evaluate for foreign bodies Exploration:    Hemostasis achieved with:  Direct pressure   Imaging obtained: x-ray     Imaging outcome: foreign body not noted     Wound exploration: wound explored through full range of motion and entire depth of wound visualized     Wound extent: no foreign bodies/material noted, no muscle damage noted, no tendon damage noted and no underlying fracture noted   Skin repair:    Repair method:  Sutures   Suture size:  4-0   Suture material:  Prolene   Suture technique:  Simple interrupted   Number of sutures:  2 Approximation:    Approximation:  Close Repair type:    Repair type:  Simple Post-procedure details:    Dressing:  Bulky dressing   Procedure completion:  Tolerated well, no immediate complications     Medications Ordered  in ED Medications  lidocaine (PF) (XYLOCAINE) 1 % injection 5 mL (has no administration in time range)  Tdap (BOOSTRIX) injection 0.5 mL (0.5 mLs Intramuscular Given 05/25/22 1117)    ED Course/ Medical Decision Making/ A&P                           Medical Decision Making Amount and/or Complexity of Data Reviewed Radiology: ordered.  Risk Prescription drug management.   31 year old male with lacerations to the left third and fourth fingers which occurred today accidentally at work with a box cutter.  Tetanus was updated today.  Found to have 2 simple lacerations, 1 to each of the third and fourth fingers near the DIP on the extensor surface.  Sensation intact, bleeding controlled, full range of motion with good strength against resistance in extension.  X-ray is negative for bony injury or foreign body.  Wounds were anesthetized with digital block with 1% plain lidocaine.  Wounds were irrigated with saline and closed with sutures as per procedure note.  Patient is advised to follow-up with his Worker's Comp. provider for wound recheck in 2 days with suture removal in 7 to 10 days.        Final Clinical Impression(s) / ED Diagnoses Final diagnoses:  Laceration of left middle finger without foreign body without damage to nail, initial encounter  Laceration of left ring finger without foreign body without damage to nail, initial encounter    Rx / DC Orders ED Discharge Orders     None         Jeannie Fend, PA-C 05/25/22 1351    Arby Barrette, MD 05/27/22 1540

## 2022-08-22 ENCOUNTER — Other Ambulatory Visit: Payer: Self-pay

## 2022-08-22 ENCOUNTER — Emergency Department (HOSPITAL_COMMUNITY)
Admission: EM | Admit: 2022-08-22 | Discharge: 2022-08-22 | Disposition: A | Discharge status: Home / Self Care | Payer: Self-pay | Attending: Emergency Medicine | Admitting: Emergency Medicine

## 2022-08-22 DIAGNOSIS — X500XXA Overexertion from strenuous movement or load, initial encounter: Secondary | ICD-10-CM | POA: Insufficient documentation

## 2022-08-22 DIAGNOSIS — R0789 Other chest pain: Secondary | ICD-10-CM | POA: Insufficient documentation

## 2022-08-22 DIAGNOSIS — Y990 Civilian activity done for income or pay: Secondary | ICD-10-CM | POA: Insufficient documentation

## 2022-08-22 DIAGNOSIS — S161XXA Strain of muscle, fascia and tendon at neck level, initial encounter: Secondary | ICD-10-CM | POA: Insufficient documentation

## 2022-08-22 MED ORDER — METHOCARBAMOL 500 MG PO TABS
500.0000 mg | ORAL_TABLET | Freq: Three times a day (TID) | ORAL | 0 refills | Status: DC | PRN
Start: 2022-08-22 — End: 2022-10-04

## 2022-08-22 NOTE — ED Provider Notes (Signed)
[facility] EMERGENCY DEPARTMENT Provider Note   CSN: [id number] Arrival date & time: 08/22/22  2030     History  Chief Complaint  Patient presents with   Neck Pain    Raymond Orozco is a 31 y.o. male who presents with left sided neck pain. He had side of the pain while lifting heavy boxes at work today.  He states that the boxes were no heavier than thinks he would normally lift about 10 pounds.  He began having pain in the upper right chest right at the sternoclavicular border and then having pain in the anterior part of his neck worse when he turns his neck right and left.  No redness heat swelling difficulty swallowing recent chiropractic adjustments, headache or vision changes.  HPI     Home Medications Prior to Admission medications   Medication Sig Start Date End Date Taking? Authorizing Provider  albuterol (PROVENTIL) (2.5 MG/3ML) 0.083% nebulizer solution Take 3 mLs (2.5 mg total) by nebulization every 6 (six) hours as needed for wheezing or shortness of breath. 02/06/22   Gustavus Bryant, FNP  albuterol (VENTOLIN HFA) 108 (90 Base) MCG/ACT inhaler Inhale 1-2 puffs into the lungs every 6 (six) hours as needed for wheezing or shortness of breath. 02/06/22   Gustavus Bryant, FNP  cetirizine (ZYRTEC ALLERGY) 10 MG tablet Take 1 tablet (10 mg total) by mouth daily. 01/03/21   Valinda Hoar, NP  emtricitabine-tenofovir AF (DESCOVY) 200-25 MG tablet Take 1 tablet by mouth daily. 05/20/21   Randall Hiss, MD  fluticasone [facility]) 50 MCG/ACT nasal spray Place 1 spray into both nostrils daily. 01/03/21   White, Elita Boone, NP  hydrocortisone (ANUSOL-HC) 2.5 % rectal cream Place 1 application rectally 2 (two) times daily. 05/14/21   Loeffler, Finis Bud, PA-C  hydrocortisone cream 0.5 % Apply 1 application topically 2 (two) times daily. Patient not taking: Reported on 03/09/2019 04/08/18   Dorrell, Cathleen Corti, MD  ondansetron (ZOFRAN ODT) 8 MG disintegrating tablet  8mg  ODT q4 hours prn nausea 03/09/19   03/11/19, MD      Allergies    Patient has no known allergies.    Review of Systems   Review of Systems  Physical Exam Updated Vital Signs BP 114/79 (BP Location: Right Arm)   Pulse 68   Temp 97.9 F (36.6 C) (Oral)   Resp 16   Ht 5\' 6"  (1.676 m)   Wt 83.5 kg   SpO2 100%   BMI 29.70 kg/m  Physical Exam Vitals and nursing note reviewed.  Constitutional:      General: He is not in acute distress.    Appearance: He is well-developed. He is not diaphoretic.  HENT:     Head: Normocephalic and atraumatic.  Eyes:     General: No scleral icterus.    Conjunctiva/sclera: Conjunctivae normal.  Neck:      Comments: tenderness to palpation along the insertion sites of the sternocleidomastoid on both the clavicle and the sternum.  Pain with left and right rotation at the sternocleidomastoid.  Mild minimal tenderness over the clavicle. Cardiovascular:     Rate and Rhythm: Normal rate and regular rhythm.     Heart sounds: Normal heart sounds.  Pulmonary:     Effort: Pulmonary effort is normal. No respiratory distress.     Breath sounds: Normal breath sounds.  Abdominal:     Palpations: Abdomen is soft.     Tenderness: There is no abdominal tenderness.  Musculoskeletal:     Cervical back: Normal range of motion and neck supple.  Skin:    General: Skin is warm and dry.  Neurological:     Mental Status: He is alert.  Psychiatric:        Behavior: Behavior normal.     ED Results / Procedures / Treatments   Labs (all labs ordered are listed, but only abnormal results are displayed) Labs Reviewed - No data to display  EKG None  Radiology No results found.  Procedures Procedures    Medications Ordered in ED Medications - No data to display  ED Course/ Medical Decision Making/ A&P                           Medical Decision Making  TTP of the SCM.  I have no concern for infection, carotid artery dissection.  This appears  to be a muscle strain secondary to lifting. Patient appears appropriate for discharge with treatment of muscle strain.  Discussed outpatient follow-up and return precautions.        Final Clinical Impression(s) / ED Diagnoses Final diagnoses:  Strain of sternocleidomastoid muscle, initial encounter    Rx / DC Orders ED Discharge Orders     None         Arthor Captain, PA-C 08/22/22 2049    Mardene Sayer, MD 08/22/22 2226

## 2022-08-22 NOTE — Discharge Instructions (Addendum)
Take Tyle or Motrin for pain.  I have also sent in a muscle relaxer to help with tightness in the muscle.  Get help right away if you have difficulty swallowing, an inability to move your neck, severe pain or fevers.  Return if he had vision changes severe headache facial droop

## 2022-08-22 NOTE — ED Triage Notes (Signed)
Pt BIB EMS for right sided neck pain. Pt denies blurred vision. Pt denies injury. Pt endorses pain when he moves his neck.

## 2022-10-04 ENCOUNTER — Ambulatory Visit
Admission: EM | Admit: 2022-10-04 | Discharge: 2022-10-04 | Disposition: A | Discharge status: Home / Self Care | Payer: Self-pay | Attending: Urgent Care | Admitting: Urgent Care

## 2022-10-04 DIAGNOSIS — J4530 Mild persistent asthma, uncomplicated: Secondary | ICD-10-CM | POA: Insufficient documentation

## 2022-10-04 DIAGNOSIS — B349 Viral infection, unspecified: Secondary | ICD-10-CM | POA: Insufficient documentation

## 2022-10-04 DIAGNOSIS — U071 COVID-19: Secondary | ICD-10-CM | POA: Insufficient documentation

## 2022-10-04 MED ORDER — PREDNISONE 20 MG PO TABS: ORAL_TABLET | ORAL | 0 refills | Status: AC

## 2022-10-04 MED ORDER — PROMETHAZINE-DM 6.25-15 MG/5ML PO SYRP: 5.0000 mL | ORAL_SOLUTION | Freq: Three times a day (TID) | ORAL | 0 refills | Status: AC | PRN

## 2022-10-04 MED ORDER — CETIRIZINE HCL 10 MG PO TABS: 10.0000 mg | ORAL_TABLET | Freq: Every day | ORAL | 0 refills | Status: AC

## 2022-10-04 NOTE — ED Triage Notes (Addendum)
Pt c/o body aches, fatigue, HA, head/chest congestion, cough/SHOB x 4 days-NAD-steady gait

## 2022-10-04 NOTE — ED Provider Notes (Signed)
[facility]  Note:  This document was prepared using Systems analyst and may include unintentional dictation errors.  MRN: [id number] DOB: [birth year]  Subjective:   Raymond Orozco is a 32 y.o. male presenting for 4 day history of body aches, fatigue, headache, sinus and chest congestion, cough, shob. Has a history of asthma. Does not need refills on his inhalers.   No current facility-administered medications for this encounter.  Current Outpatient Medications:    albuterol (PROVENTIL) (2.5 MG/3ML) 0.083% nebulizer solution, Take 3 mLs (2.5 mg total) by nebulization every 6 (six) hours as needed for wheezing or shortness of breath., Disp: 75 mL, Rfl: 12   albuterol (VENTOLIN HFA) 108 (90 Base) MCG/ACT inhaler, Inhale 1-2 puffs into the lungs every 6 (six) hours as needed for wheezing or shortness of breath., Disp: 1 each, Rfl: 0   cetirizine (ZYRTEC ALLERGY) 10 MG tablet, Take 1 tablet (10 mg total) by mouth daily., Disp: 30 tablet, Rfl: 1   emtricitabine-tenofovir AF (DESCOVY) 200-25 MG tablet, Take 1 tablet by mouth daily., Disp: 30 tablet, Rfl: 0   fluticasone (FLONASE) 50 MCG/ACT nasal spray, Place 1 spray into both nostrils daily., Disp: 11.1 mL, Rfl: 1   hydrocortisone (ANUSOL-HC) 2.5 % rectal cream, Place 1 application rectally 2 (two) times daily., Disp: 30 g, Rfl: 0   hydrocortisone cream 0.5 %, Apply 1 application topically 2 (two) times daily. (Patient not taking: Reported on 03/09/2019), Disp: 30 g, Rfl: 0   methocarbamol (ROBAXIN) 500 MG tablet, Take 1 tablet (500 mg total) by mouth 3 (three) times daily as needed for muscle spasms., Disp: 21 tablet, Rfl: 0   ondansetron (ZOFRAN ODT) 8 MG disintegrating tablet, 8mg  ODT q4 hours prn nausea, Disp: 4 tablet, Rfl: 0   No Known Allergies  Past Medical History:  Diagnosis Date   Asthma    Chlamydia 05/20/2021   Deaf, left    Hemorrhoid 05/20/2021   Monkeypox 05/20/2021   On pre-exposure  prophylaxis for HIV 05/20/2021     History reviewed. No pertinent surgical history.  Family History  Family history unknown: Yes    Social History   Tobacco Use   Smoking status: Every Day    Types: Cigars   Smokeless tobacco: Never  Vaping Use   Vaping Use: Never used  Substance Use Topics   Alcohol use: Not Currently   Drug use: No    ROS   Objective:   Vitals: BP 119/66 (BP Location: Right Arm)   Pulse 70   Temp 98.2 F (36.8 C) (Oral)   Resp 20   SpO2 97%   Physical Exam Constitutional:      General: He is not in acute distress.    Appearance: Normal appearance. He is well-developed and normal weight. He is not ill-appearing, toxic-appearing or diaphoretic.  HENT:     Head: Normocephalic and atraumatic.     Right Ear: Tympanic membrane, ear canal and external ear normal. No drainage, swelling or tenderness. No middle ear effusion. There is no impacted cerumen. Tympanic membrane is not erythematous or bulging.     Left Ear: Tympanic membrane, ear canal and external ear normal. No drainage, swelling or tenderness.  No middle ear effusion. There is no impacted cerumen. Tympanic membrane is not erythematous or bulging.     Nose: Congestion present. No rhinorrhea.     Mouth/Throat:     Mouth: Mucous membranes are moist.     Pharynx: No oropharyngeal exudate or  posterior oropharyngeal erythema.  Eyes:     General: No scleral icterus.       Right eye: No discharge.        Left eye: No discharge.     Extraocular Movements: Extraocular movements intact.     Conjunctiva/sclera: Conjunctivae normal.  Cardiovascular:     Rate and Rhythm: Normal rate and regular rhythm.     Heart sounds: Normal heart sounds. No murmur heard.    No friction rub. No gallop.  Pulmonary:     Effort: Pulmonary effort is normal. No respiratory distress.     Breath sounds: Normal breath sounds. No stridor. No wheezing, rhonchi or rales.  Musculoskeletal:     Cervical back: Normal range of  motion and neck supple. No rigidity. No muscular tenderness.  Neurological:     General: No focal deficit present.     Mental Status: He is alert and oriented to person, place, and time.  Psychiatric:        Mood and Affect: Mood normal.        Behavior: Behavior normal.        Thought Content: Thought content normal.     Assessment and Plan :   PDMP not reviewed this encounter.  1. Acute viral syndrome   2. Mild persistent asthma, uncomplicated     Deferred imaging given clear cardiopulmonary exam, hemodynamically stable vital signs. In the context of his asthma and his respiratory symptoms, recommended an oral prednisone course. Will manage for viral illness such as viral URI, viral syndrome, viral rhinitis, COVID-19. Recommended supportive care. Offered scripts for symptomatic relief. Testing is pending. Counseled patient on potential for adverse effects with medications prescribed/recommended today, ER and return-to-clinic precautions discussed, patient verbalized understanding.   If patient tests positive for COVID 19, needs COVID antivirals.     Jaynee Eagles, [place] 10/04/22 [phone]

## 2022-10-04 NOTE — Discharge Instructions (Signed)
We will notify you of your test results as they arrive and may take between about 24 hours.  I encourage you to sign up for MyChart if you have not already done so as this can be the easiest way for [place] to communicate results to you online or through a phone app.  Generally, we only contact you if it is a positive test result.  In the meantime, if you develop worsening symptoms including fever, chest pain, shortness of breath despite our current treatment plan then please report to the emergency room as this may be a sign of worsening status from possible viral infection.  Otherwise, we will manage this as a viral syndrome. For sore throat or cough try using a honey-based tea. Use 3 teaspoons of honey with juice squeezed from half lemon. Place shaved pieces of ginger into 1/2-1 cup of water and warm over stove top. Then mix the ingredients and repeat every 4 hours as needed. Please take Tylenol 500mg -650mg  every 6 hours for aches and pains, fevers. Hydrate very well with at least 2 liters of water. Eat light meals such as soups to replenish electrolytes and soft fruits, veggies. Start an antihistamine like Zyrtec for postnasal drainage, sinus congestion.  You can take this together with prednisone and your albuterol treatments.  Use the cough medications as needed.

## 2022-10-05 LAB — SARS CORONAVIRUS 2 (TAT 6-24 HRS): SARS Coronavirus 2: POSITIVE — AB
